# Patient Record
Sex: Female | Born: 1960 | Race: White | Hispanic: No | Marital: Married | Smoking: Never smoker
Health system: Southern US, Community
[De-identification: ages and names within clinical notes are randomized; demographics above are authoritative.]

## PROBLEM LIST (undated history)

## (undated) DIAGNOSIS — T4145XA Adverse effect of unspecified anesthetic, initial encounter: Secondary | ICD-10-CM

## (undated) DIAGNOSIS — R51 Headache: Secondary | ICD-10-CM

## (undated) DIAGNOSIS — G2581 Restless legs syndrome: Secondary | ICD-10-CM

## (undated) DIAGNOSIS — Z1379 Encounter for other screening for genetic and chromosomal anomalies: Secondary | ICD-10-CM

## (undated) DIAGNOSIS — R519 Headache, unspecified: Secondary | ICD-10-CM

## (undated) DIAGNOSIS — F329 Major depressive disorder, single episode, unspecified: Secondary | ICD-10-CM

## (undated) DIAGNOSIS — F1120 Opioid dependence, uncomplicated: Secondary | ICD-10-CM

## (undated) DIAGNOSIS — T8859XA Other complications of anesthesia, initial encounter: Secondary | ICD-10-CM

## (undated) DIAGNOSIS — E785 Hyperlipidemia, unspecified: Secondary | ICD-10-CM

## (undated) DIAGNOSIS — Z803 Family history of malignant neoplasm of breast: Secondary | ICD-10-CM

## (undated) DIAGNOSIS — F1320 Sedative, hypnotic or anxiolytic dependence, uncomplicated: Secondary | ICD-10-CM

## (undated) DIAGNOSIS — I10 Essential (primary) hypertension: Secondary | ICD-10-CM

## (undated) DIAGNOSIS — F419 Anxiety disorder, unspecified: Secondary | ICD-10-CM

## (undated) DIAGNOSIS — R112 Nausea with vomiting, unspecified: Secondary | ICD-10-CM

## (undated) DIAGNOSIS — Z9889 Other specified postprocedural states: Secondary | ICD-10-CM

## (undated) DIAGNOSIS — G4700 Insomnia, unspecified: Secondary | ICD-10-CM

## (undated) DIAGNOSIS — Z8719 Personal history of other diseases of the digestive system: Secondary | ICD-10-CM

## (undated) DIAGNOSIS — F32A Depression, unspecified: Secondary | ICD-10-CM

## (undated) HISTORY — DX: Insomnia, unspecified: G47.00

## (undated) HISTORY — DX: Anxiety disorder, unspecified: F41.9

## (undated) HISTORY — DX: Headache, unspecified: R51.9

## (undated) HISTORY — DX: Sedative, hypnotic or anxiolytic dependence, uncomplicated: F13.20

## (undated) HISTORY — DX: Restless legs syndrome: G25.81

## (undated) HISTORY — DX: Opioid dependence, uncomplicated: F11.20

## (undated) HISTORY — DX: Hyperlipidemia, unspecified: E78.5

## (undated) HISTORY — DX: Family history of malignant neoplasm of breast: Z80.3

## (undated) HISTORY — PX: BACK SURGERY: SHX140

## (undated) HISTORY — DX: Encounter for other screening for genetic and chromosomal anomalies: Z13.79

---

## 1997-09-16 ENCOUNTER — Other Ambulatory Visit: Admission: RE | Admit: 1997-09-16 | Discharge: 1997-09-16 | Payer: Self-pay | Admitting: Obstetrics and Gynecology

## 1998-01-19 ENCOUNTER — Ambulatory Visit (HOSPITAL_COMMUNITY): Admission: AD | Admit: 1998-01-19 | Discharge: 1998-01-19 | Payer: Self-pay | Admitting: Obstetrics and Gynecology

## 1998-02-01 ENCOUNTER — Ambulatory Visit (HOSPITAL_COMMUNITY): Admission: RE | Admit: 1998-02-01 | Discharge: 1998-02-01 | Payer: Self-pay | Admitting: Obstetrics and Gynecology

## 1998-04-14 ENCOUNTER — Inpatient Hospital Stay (HOSPITAL_COMMUNITY): Admission: AD | Admit: 1998-04-14 | Discharge: 1998-04-16 | Payer: Self-pay | Admitting: Obstetrics and Gynecology

## 1998-05-25 ENCOUNTER — Other Ambulatory Visit: Admission: RE | Admit: 1998-05-25 | Discharge: 1998-05-25 | Payer: Self-pay | Admitting: Obstetrics and Gynecology

## 1999-07-11 ENCOUNTER — Other Ambulatory Visit: Admission: RE | Admit: 1999-07-11 | Discharge: 1999-07-11 | Payer: Self-pay | Admitting: Obstetrics and Gynecology

## 2000-10-17 ENCOUNTER — Other Ambulatory Visit: Admission: RE | Admit: 2000-10-17 | Discharge: 2000-10-17 | Payer: Self-pay | Admitting: Obstetrics and Gynecology

## 2001-08-18 ENCOUNTER — Other Ambulatory Visit: Admission: RE | Admit: 2001-08-18 | Discharge: 2001-08-18 | Payer: Self-pay | Admitting: Obstetrics and Gynecology

## 2007-01-21 ENCOUNTER — Ambulatory Visit (HOSPITAL_COMMUNITY): Admission: RE | Admit: 2007-01-21 | Discharge: 2007-01-21 | Payer: Self-pay | Admitting: Obstetrics and Gynecology

## 2007-10-01 ENCOUNTER — Encounter: Admission: RE | Admit: 2007-10-01 | Discharge: 2007-10-01 | Payer: Self-pay | Admitting: Neurological Surgery

## 2007-10-10 ENCOUNTER — Ambulatory Visit (HOSPITAL_COMMUNITY): Admission: RE | Admit: 2007-10-10 | Discharge: 2007-10-11 | Payer: Self-pay | Admitting: Neurological Surgery

## 2007-11-10 ENCOUNTER — Encounter: Admission: RE | Admit: 2007-11-10 | Discharge: 2007-11-10 | Payer: Self-pay | Admitting: Neurological Surgery

## 2008-01-06 ENCOUNTER — Encounter: Admission: RE | Admit: 2008-01-06 | Discharge: 2008-01-06 | Payer: Self-pay | Admitting: Neurological Surgery

## 2008-04-05 ENCOUNTER — Encounter: Admission: RE | Admit: 2008-04-05 | Discharge: 2008-04-05 | Payer: Self-pay | Admitting: Neurological Surgery

## 2008-04-20 ENCOUNTER — Ambulatory Visit: Payer: Self-pay | Admitting: Internal Medicine

## 2008-04-26 DIAGNOSIS — R002 Palpitations: Secondary | ICD-10-CM

## 2008-04-26 HISTORY — DX: Palpitations: R00.2

## 2008-04-30 ENCOUNTER — Ambulatory Visit: Payer: Self-pay

## 2008-04-30 ENCOUNTER — Ambulatory Visit: Payer: Self-pay | Admitting: Internal Medicine

## 2008-04-30 ENCOUNTER — Encounter: Payer: Self-pay | Admitting: Internal Medicine

## 2008-05-07 ENCOUNTER — Ambulatory Visit: Payer: Self-pay | Admitting: Internal Medicine

## 2008-05-07 ENCOUNTER — Encounter: Admission: RE | Admit: 2008-05-07 | Discharge: 2008-05-07 | Payer: Self-pay | Admitting: Neurological Surgery

## 2008-12-22 ENCOUNTER — Ambulatory Visit (HOSPITAL_COMMUNITY): Admission: RE | Admit: 2008-12-22 | Discharge: 2008-12-22 | Payer: Self-pay | Admitting: Obstetrics and Gynecology

## 2009-01-20 ENCOUNTER — Ambulatory Visit (HOSPITAL_COMMUNITY): Admission: RE | Admit: 2009-01-20 | Discharge: 2009-01-20 | Payer: Self-pay | Admitting: Obstetrics and Gynecology

## 2009-06-24 ENCOUNTER — Encounter: Admission: RE | Admit: 2009-06-24 | Discharge: 2009-06-24 | Payer: Self-pay | Admitting: Neurology

## 2009-08-19 ENCOUNTER — Encounter: Admission: RE | Admit: 2009-08-19 | Discharge: 2009-08-19 | Payer: Self-pay | Admitting: Neurological Surgery

## 2010-05-09 IMAGING — CT CT CERVICAL SPINE W/ CM
3 of 4 series · 16 of 28 positions shown, 18 images · IV contrast (omnipaque)
Comparison: none

CLINICAL DATA: Left upper extremity pain.  No previous cervical
surgery.

CERVICAL MYELOGRAM
CT CERVICAL SPINE WITH INTRATHECAL CONTRAST
TECHNIQUE: An appropriate entry site was determined under
fluoroscopy. Skin site was marked, prepped with Betadine, and
draped in usual sterile fashion, and infiltrated locally with 1%
lidocaine. A 22 gauge spinal needle was  advanced into the thecal
sac at L4-5 from right interlaminar approach. Clear colorless CSF
returned. 10 ml Omnipaque 300 were administered intrathecally for
cervical myelography, followed by axial CT scanning of the cervical
spine. Coronal and sagittal reconstructions were generated.

[Series 2: cervical spine · axial · 0.23mm/px · z∈[+58,+180]mm · 5 of 148 slices shown, 7 images]
[im 25/148  soft-tissue]
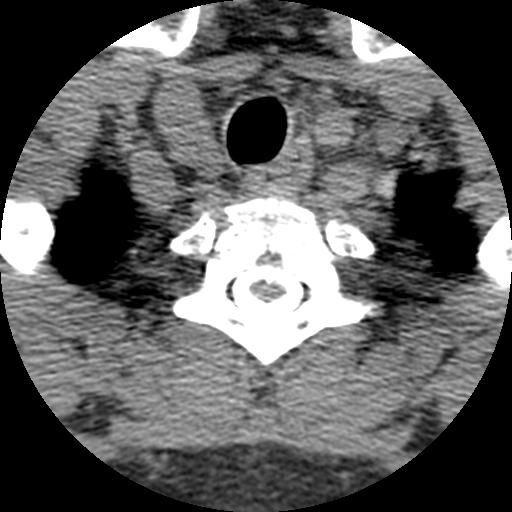
[im 25/148  bone]
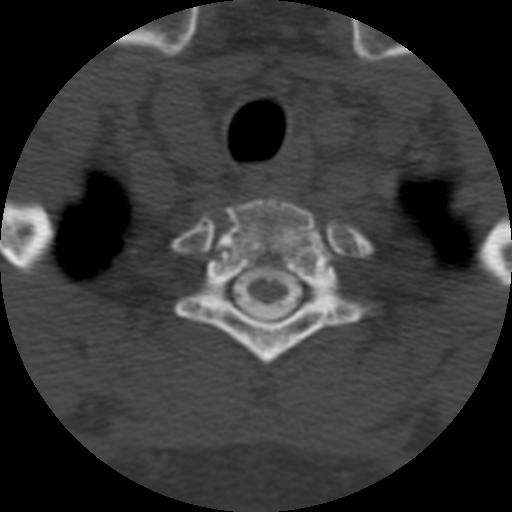
[im 50/148  bone]
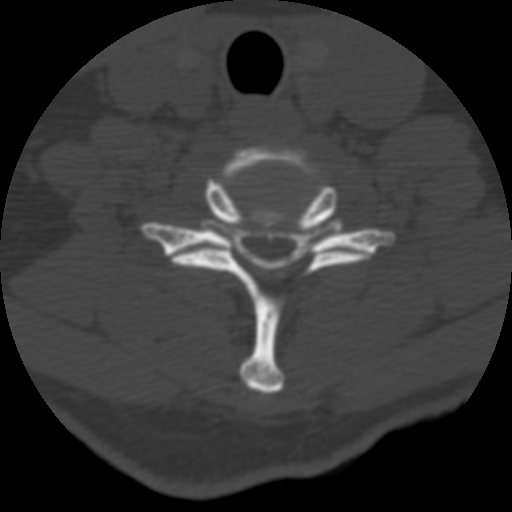
[im 74/148  bone]
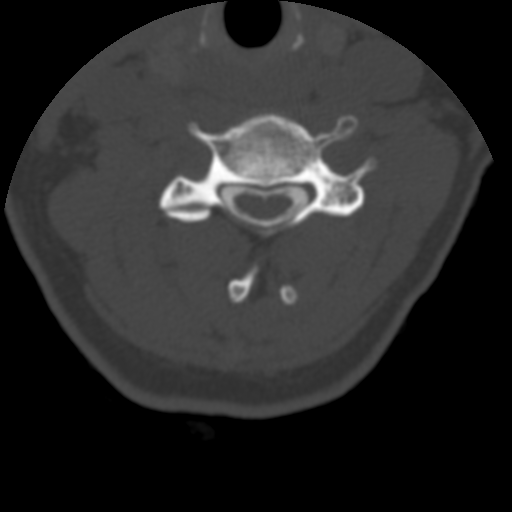
[im 99/148  bone]
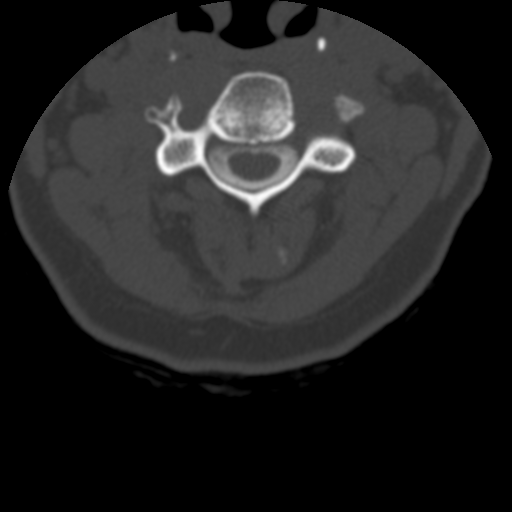
[im 123/148  soft-tissue]
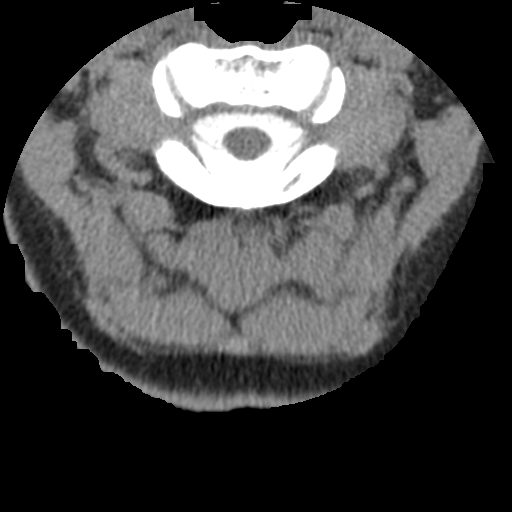
[im 123/148  bone]
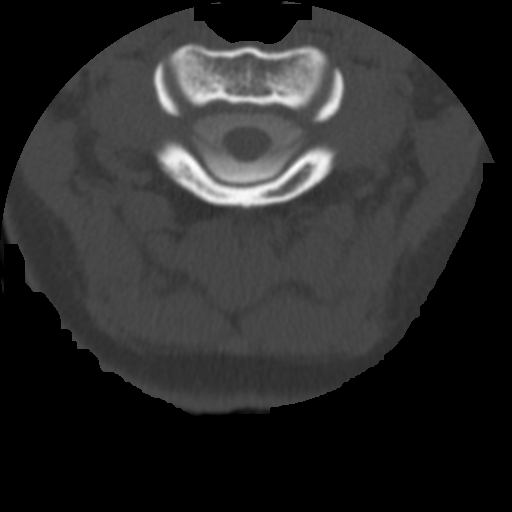

[Series 3: bone windows · axial · 0.23mm/px · z∈[+58,+180]mm · 5 of 148 slices shown]
[im 25/148  bone]
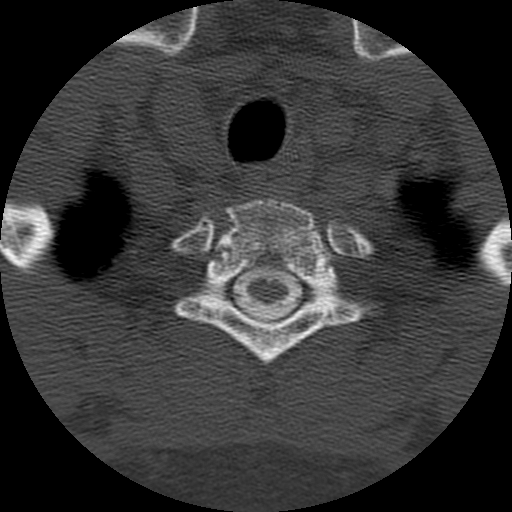
[im 50/148  bone]
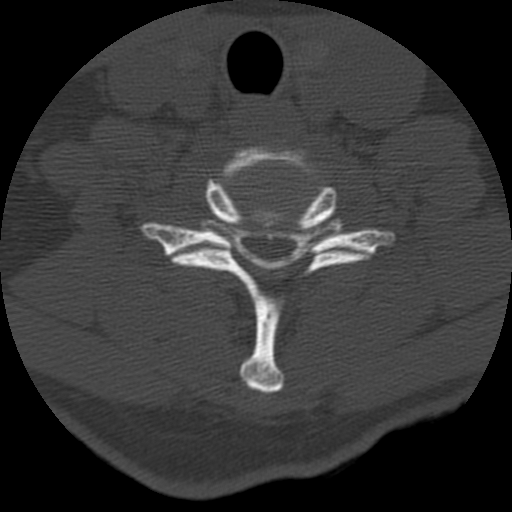
[im 74/148  bone]
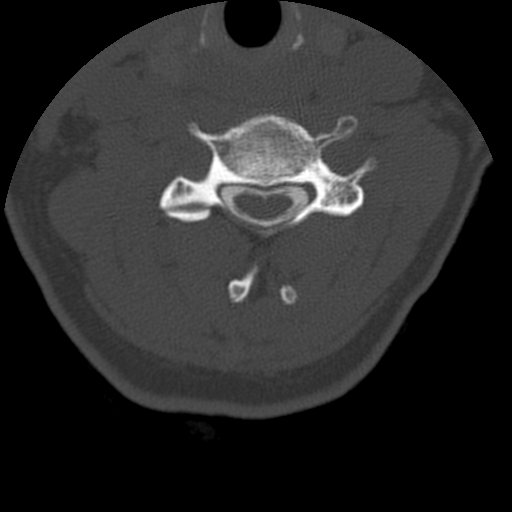
[im 99/148  bone]
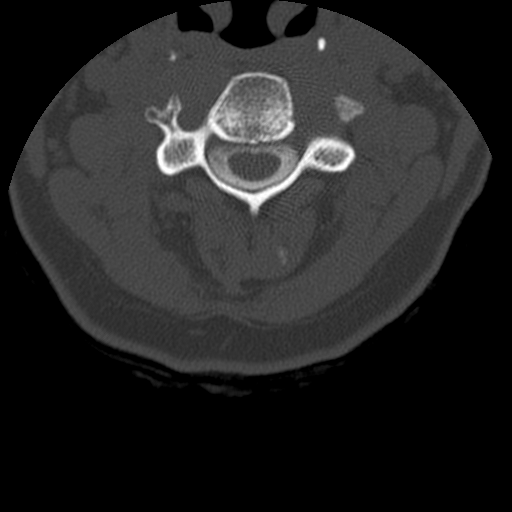
[im 123/148  bone]
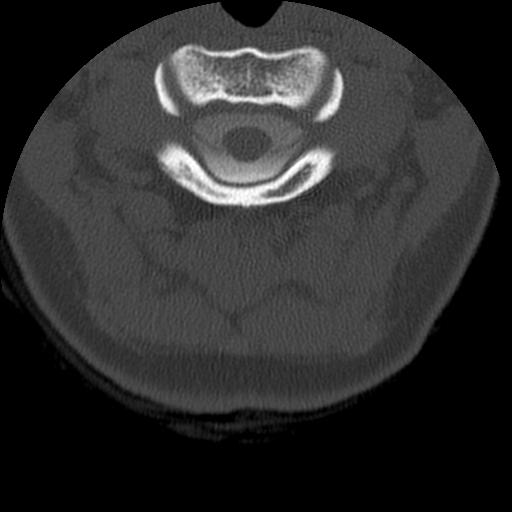

[Series 400: cor c-spine · coronal · 0.37mm/px · 6 of 40 slices shown]
[im 2/40  soft-tissue]
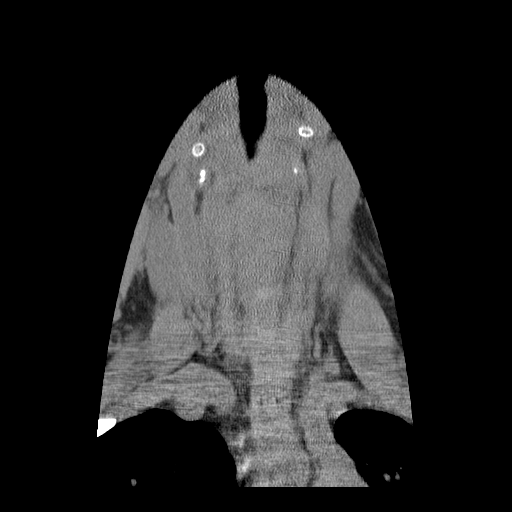
[im 7/40  bone]
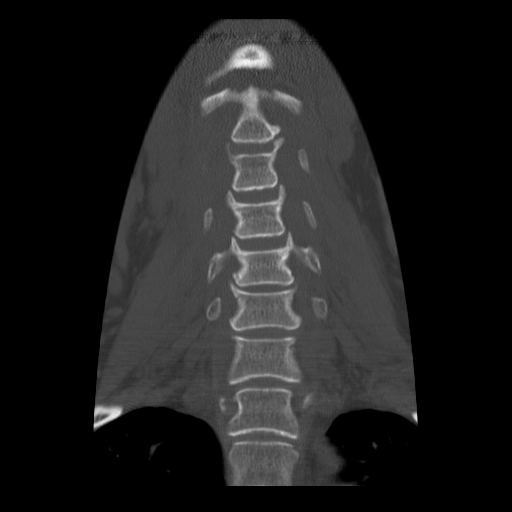
[im 14/40  bone]
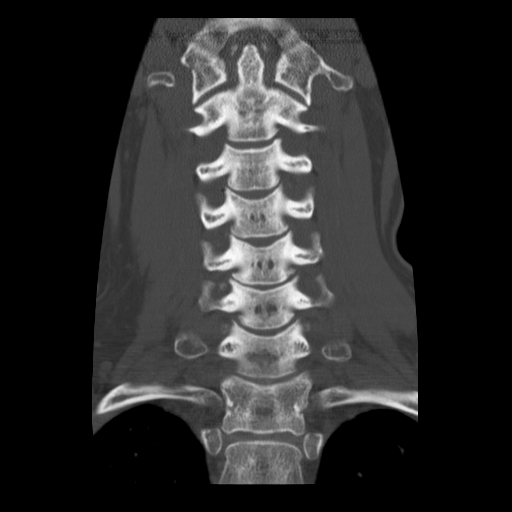
[im 20/40  bone]
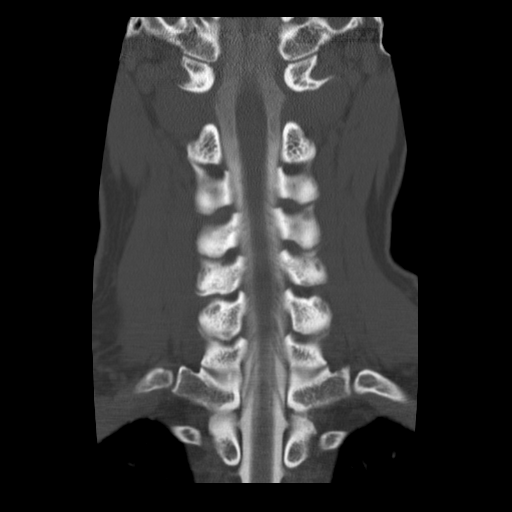
[im 27/40  bone]
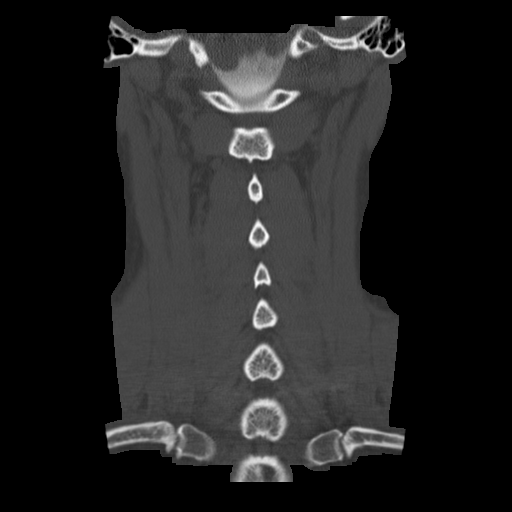
[im 33/40  bone]
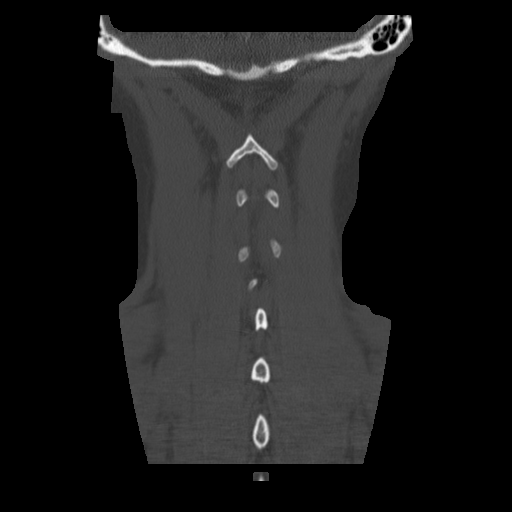

[16 of 28 positions shown; findings below may reference images not displayed]

FINDINGS: Normal alignment.  No prevertebral soft tissue swelling.
Bilateral low attenuation thyroid lesions are noted.  Visualized
lung apices clear.

C2-3: Unremarkable

C3-4: Broad central protrusion with mild flattening of the anterior
aspect of the cervical cord, right greater than left.  Foramina
widely patent.

C4-5: Mild bulge resulting in mild flattening and distortion of the
anterior aspect of the cervical cord.  Foramen are widely patent.

C5-6: Narrowing of the interspace with a broad posterior protrusion
with calcified right posterolateral and noncalcified left foraminal
components.  These contribute to moderate narrowing of the AP
diameter of the central canal with cord flattening and distortion,
right worse than left.  There is also cut off of the left C6 nerve
root sleeve.

C6-7: Mild narrowing of the interspace with mild circumferential
disc bulge and tiny endplate spurs.  There is mild flattening of
the anterior aspect of the cervical cord without significant
stenosis.  Foramina remain patent.

C7-T1: Unremarkable
IMPRESSION: 1.  Advanced degenerative disc disease C5-6 with a broad right
posterolateral hard disc and noncalcified left foraminal protrusion
probably affecting the left C6 nerve root.
2.  Mild anterior cord flattening caused by mild disc protrusions
C3-4, C4-5, and C6-7.
3.  Bilateral thyroid lesions are incidentally noted, incompletely
evaluated.  In the absence of previous studies, consider thyroid
ultrasound for further characterization to exclude neoplasm.

## 2010-10-03 NOTE — Op Note (Signed)
Elizabeth Benjamin, Elizabeth Benjamin                 ACCOUNT NO.:  [id number]   MEDICAL RECORD NO.:  [id number]          PATIENT TYPE:  OIB   LOCATION:  3535                         FACILITY:  [facility]   PHYSICIAN:  Tia Alert, MD     DATE OF BIRTH:  February 26, 1961   DATE OF PROCEDURE:  10/10/2007  DATE OF DISCHARGE:                               OPERATIVE REPORT   PREOPERATIVE DIAGNOSES:  1. Cervical disk herniation.  2. 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   POSTOPERATIVE DIAGNOSES:  1. Cervical disk herniation.  2. 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   PROCEDURE:  1. Decompressive anterior cervical diskectomy C5-6.  2. Anterior cervical arthrodesis C5-6 utilizing a 7-mm      corticocancellous allograft.  3. Anterior cervical plating C5-6 utilizing a 23-mm Atlantis Venture      plate.   SURGEON:  Tia Alert, MD   ASSISTANT:  Reinaldo Meeker, MD   ANESTHESIA:  General endotracheal.   COMPLICATIONS:  None apparent.   INDICATIONS FOR PROCEDURE:  Ms. Lightcap is a 50 year old female who is  referred with severe neck and left arm pain.  She had an MRI and CT  myelogram, which showed a soft disk herniation to the left at C5-6 with  cutoff of the left C6 nerve root.  She also had a large spur to the  right at C5-6 coming off the inferior endplate of C5.  We recommended  anterior cervical diskectomy fusion and plating.  She understood the  risks, benefits, and expected outcome and wished to proceed.   DESCRIPTION OF PROCEDURE:  The patient was taken to the operating room.  After induction of adequate general endotracheal anesthesia, the patient  was placed in the supine position on the operating room table.  Her  right anterior cervical region was prepped with DuraPrep and draped in  the usual sterile fashion.  A 3 mL of local anesthesia was injected and  transverse incision was made to the right of midline, and carried down  to the platysma.  Once it was elevated, opened, and  undermined with  Metzenbaum scissors, I then dissected in a plane medial to the  sternocleidomastoid muscle and internal carotid artery, and lateral to  the trachea and esophagus to expose C5-6 intraoperative fluoroscopy  confirmed my level at C5-6 and the longus colli muscles were taken down  and a Shadow-Line retractors were placed under this to expose C5 and C6.  The anterior annulus was incised and the initial diskectomy was done  with pituitary rongeurs and curved curettes.  We then used the high-  speed drill to drill the endplates down to the level of the posterior  longitudinal ligament.  The ligament was opened and removed in a  piecemeal fashion undercutting the bodies of C5 and C6.  She was found  to have a large disk herniation over the left C6 nerve root.  She had a  large spur coming off the inferior endplate of C5, this was undercut and  removed.  We marched along the superior endplate of C6 to identify  the  pedicle of C6 on the left marched along the nerve root to identify the  nerve root that performed a generous foraminotomy over the C6 nerve  root.  Dissected out until I identified nerve root distal to the  pedicle.  I then palpated with a nerve hook in a circumferential  fashion.  After my decompression was complete, the dura was full and  capacious, nerve root passed easily along the nerve roots.  I then  irrigated with saline solution containing bacitracin and measured  interspace to be 7 mm.  Used 7-mm corticocancellous allograft and tapped  this into position at C5-6.  I then used a 23-mm Atlantis Venture plate  and placed two 13-mm variable angle screws in the bodies of C5 and C6  and these were locked in the plate by locking mechanism within the  plate.  We then irrigated with saline solution containing bacitracin,  dried all bleeding points with bipolar cautery, and once meticulous  hemostasis was achieved, closed the platysma with 3-0 Vicryl, closed the   subcuticular tissue with 3-0 Vicryl, and closed skin with Benzoin and  Steri-Strips.  The drapes removed.  Sterile dressing was applied.  The  patient was awakened from general anesthesia and transferred to the  recovery room in stable condition.  At the end of the procedure, all  sponge, needle, and instrument counts were correct.      Tia Alert, MD  Electronically Signed     DSJ/MEDQ  D:  10/10/2007  T:  10/11/2007  Job:  [id number]

## 2010-10-03 NOTE — Letter (Signed)
April 20, 2008    Elizabeth Mire, MD  [address] [place], [place] [address]   RE:  Elizabeth Benjamin, Elizabeth Benjamin  MRN:  [id number]  /  DOB:  Oct 21, 1960   Dear Dr. Arelia Benjamin:   It was my pleasure to see your patient, Elizabeth Benjamin in EP consultation  today.  As you recall she is a very pleasant 50 year old female with  symptoms of heart racing.  She reports that these episodes have been  present approximately 6 months and appear to have begun after cervical  fusion in May 2009.  She reports gradual onset and termination of heart  racing.  These episodes are most noticeable in the evenings  particularly when she tries to sleep.  She is also woken from sleep at  approximately 3 and 4 o'clock in the morning with heart racing .  She  reports fleeting chest discomfort, but denies shortness of breath,  presyncope, or syncope during these episodes.  She has also developed  gradual shortness of breath over the past few months.  She continues to  ride a stationary bike 30 minutes a day or walk 60 minutes per day  without noticeable decline in activity; however, she does note  occasional dyspnea with one flight of stairs.  She is unaware of any  triggers or precipitance of her heart racing and is also unaware of  anything that seems to make him better.  She does not however that  during the day when she is quite active, she does not have episodes.  She denies any recent changes in her medications.  She has documented  heart rates at home during these episodes and they are typically between  80 and 122 beats per minute.  She is otherwise without complaint today.   PAST MEDICAL HISTORY:  1. Degenerative disk disease status post cervical decompression, Oct 10, 2007.  2. Anxiety.   ALLERGIES:  No known drug allergies.   CURRENT MEDICATIONS:  1. Multivitamin daily.  2. Ambien ER 12.5 mg daily.  3. Zoloft 50 mg daily.   SOCIAL HISTORY:  The patient lives in [place] with her husband and  four  children.  She has recently been told that she is now [redacted] weeks pregnant.  She denies tobacco, alcohol, or drug use.   FAMILY HISTORY:  She is unaware of any family history of arrhythmias or  sudden death.   REVIEW OF SYSTEMS:  All systems are reviewed and negative except as  outlined in the HPI above.   PHYSICAL EXAMINATION:  VITALS:  Blood pressure 120/80, heart rate 67,  respirations 18, weight 155 pounds.  GENERAL:  The patient is a well-appearing female in no acute distress.  She is alert and oriented x3.  HEENT:  Normocephalic, atraumatic.  Sclerae clear.  Conjunctivae pink.  Oropharynx clear.  NECK:  Supple.  No JVD, lymphadenopathy, or bruits.  LUNGS:  Clear to auscultation bilaterally.  HEART:  Regular rate and rhythm.  No murmurs, rubs, or gallops.  GI:  Soft, nontender, nondistended.  Positive bowel sounds.  EXTREMITIES:  No clubbing, cyanosis, or edema.  NEUROLOGIC:  Cranial nerves II through XII are intact.  Strength and  sensation are intact.  SKIN:  No ecchymosis or lacerations.  MUSCULOSKELETAL:  No deformity or atrophy.   EKG normal sinus rhythm at 67 beats per minute, incomplete right bundle  branch block, otherwise unremarkable.   IMPRESSION:  Elizabeth Benjamin is a pleasant 50 year old female with  symptoms of  heart racing which had been present over the past 6 months.  Her  description of a gradual onset and termination of tachycardia suggest a  benign etiology.  Nevertheless, she could certainly have a  supraventricular tachycardia.   PLAN:  Therapeutic strategies for palpitations and tachycardia were  discussed in detail with the patient today.  We will obtain a 48-hour  Holter to evaluate her symptoms.  We will also obtain a transthoracic  echocardiogram to evaluate for structural heart disease.  I have asked  the patient  to return to clinic in 3 weeks to discuss these results.  Thank you for  the opportunity of participating in the care of Elizabeth Benjamin.   Please feel  free to contact me if you wish to discuss this further.    Sincerely,      Elizabeth Range, MD  Electronically Signed    JA/MedQ  DD: 04/20/2008  DT: 04/21/2008  Job #: [id number]   CC:    Elizabeth Marion, MD

## 2010-10-03 NOTE — Letter (Signed)
May 07, 2008    Juluis Mire, MD  [address] [place],  [place] [address]   RE:  Elizabeth, Benjamin  MRN:  [id number]  /  DOB:  August 11, 1960   Dear Dr. Arelia Sneddon:   It was my pleasure to see Elizabeth Benjamin in electrophysiology followup  today.  As you recall, she is a pleasant 50 year old female with  symptoms of heart racing over the past 6 months.  Upon recent  evaluation in my clinic it was felt that she likely had sinus  tachycardia secondary to anxiety.  Nevertheless, did think that it was  prudent to evaluate for other arrhythmias.  We therefore placed a 24-  hour Holter, as well as obtain an echocardiogram.  The patient reports  doing very well since that time.  She notes that her symptoms of heart  racing have significantly improved since discontinuing Effexor and  starting Zoloft.  She feels that her symptoms were primarily due to  anxiety and have improved with treatment of anxiety.  She is otherwise  without complaint today.   PROBLEM:  1. Anxiety.  2. Degenerative disk disease status post cervical decompression, Oct 10, 2007.   CURRENT MEDICATIONS:  1. Zoloft 50 mg daily.  2. Ambien CR 12.5 mg daily.  3. Multivitamin daily.  4. Meloxicam 15 mg b.i.d.   PHYSICAL EXAMINATION:  VITAL SIGNS:  Blood pressure 110/80, heart rate  62, respirations 18, weight 155 pounds.  GENERAL:  The patient is a well-appearing female in no acute distress.  She is alert and oriented x3.  HEENT:  Normocephalic, atraumatic.  Sclerae clear.  Conjunctivae pink.  Oropharynx clear.  NECK:  Supple.  No JVD, lymphadenopathy, or bruits.  LUNGS:  Clear to auscultation bilaterally.  HEART:  Regular rate and rhythm.  No murmurs, rubs, or gallops.  GI:  Soft, nontender, nondistended.  Positive bowel sounds.  EXTREMITIES:  No clubbing, cyanosis, or edema.  NEUROLOGIC:  Nonfocal.   Holter obtained, April 30, 2008, reveals sinus rhythm with an average  heart rate of 69 beats per  minute.  The minimum heart rate is 54 with a  maximum heart rate of 116 beats per minute.  No significant arrhythmias  were identified.   Transthoracic echocardiogram obtained on April 30, 2008, reveals a  left ventricular ejection fraction of 55-60% with no significant wall  motion abnormalities.  Mild mitral regurgitation was observed, otherwise  the study was unremarkable.   IMPRESSION:  Elizabeth Benjamin is a very pleasant 50 year old female with  symptoms of heart racing which had significantly improved since  treating her anxiety.  A recent Holter monitor revealed a normal heart  rate range without significant arrhythmias.  She also has no evidence of  structural heart disease by echocardiogram.  I therefore think that her  symptoms are benign.  I have made no changes to her medicines today and  have recommended that she continue to follow in your office.   PLAN:  1. No medication changes today.  2. The patient is aware that she may contact my office at any time for      followup.   Thank you for the opportunity of participating in the care of Elizabeth Benjamin.  Please feel free to contact me should any problems arise.    Sincerely,      [place] Range, MD  Electronically Signed    JA/MedQ  DD: 05/07/2008  DT: 05/08/2008  Job #: [id number]

## 2011-02-14 LAB — CBC
HCT: 39.7
Hemoglobin: 13.7
MCHC: 34.4
Platelets: 165
WBC: 4.8

## 2011-02-14 LAB — PROTIME-INR: Prothrombin Time: 12.2

## 2011-02-14 LAB — BASIC METABOLIC PANEL
Calcium: 9.4
Chloride: 103
GFR calc Af Amer: >60
Glucose, Bld: 92
Potassium: 4.5
Sodium: 140

## 2011-02-14 LAB — DIFFERENTIAL
Basophils Relative: 0
Eosinophils Absolute: 0.1
Eosinophils Relative: 1
Lymphocytes Relative: 32
Lymphs Abs: 1.5
Monocytes Absolute: 0.5
Neutrophils Relative %: 56

## 2011-02-14 LAB — APTT: aPTT: 29

## 2011-03-02 LAB — RH IMMUNE GLOBULIN WORKUP (NOT WOMEN'S HOSP)
ABO/RH(D): O NEG
Antibody Screen: NEGATIVE

## 2012-02-15 ENCOUNTER — Other Ambulatory Visit: Payer: Self-pay | Admitting: Neurological Surgery

## 2012-02-15 DIAGNOSIS — M542 Cervicalgia: Secondary | ICD-10-CM

## 2012-02-25 ENCOUNTER — Ambulatory Visit
Admission: RE | Admit: 2012-02-25 | Discharge: 2012-02-25 | Disposition: A | Discharge status: Home / Self Care | Payer: 59 | Source: Ambulatory Visit | Attending: Neurological Surgery | Admitting: Neurological Surgery

## 2012-02-25 VITALS — BP 138/78 | HR 72

## 2012-02-25 DIAGNOSIS — M542 Cervicalgia: Secondary | ICD-10-CM

## 2012-02-25 MED ORDER — HYDROMORPHONE HCL PF 2 MG/ML IJ SOLN
2.0000 mg | Freq: Once | INTRAMUSCULAR | Status: AC
  Administered 2012-02-25: 2 mg via INTRAMUSCULAR

## 2012-02-25 MED ORDER — DIAZEPAM 5 MG PO TABS
10.0000 mg | ORAL_TABLET | Freq: Once | ORAL | Status: AC
  Administered 2012-02-25: 10 mg via ORAL

## 2012-02-25 MED ORDER — ONDANSETRON HCL 4 MG/2ML IJ SOLN
4.0000 mg | Freq: Once | INTRAMUSCULAR | Status: AC
  Administered 2012-02-25: 4 mg via INTRAMUSCULAR

## 2012-02-25 MED ORDER — IOHEXOL 300 MG/ML  SOLN
10.0000 mL | Freq: Once | INTRAMUSCULAR | Status: AC | PRN
  Administered 2012-02-25: 10 mL via INTRATHECAL

## 2012-02-25 NOTE — Progress Notes (Signed)
Pt requested pain meds for neck pain during myelo, meds given.  dd

## 2012-02-25 NOTE — Progress Notes (Signed)
Patient states she has been off Cymbalta and Imitrex for the past two days.  Donell Sievert, RN

## 2013-11-11 ENCOUNTER — Other Ambulatory Visit: Payer: Self-pay | Admitting: Neurological Surgery

## 2013-11-17 NOTE — Pre-Procedure Instructions (Addendum)
Shakerria A Goetze  11/17/2013   Your procedure is scheduled on:  Thursday, July 10th.   [facility]  [facility]30  PM.  Call this number if you have problems the morning of surgery: [phone]   Remember:   Do not eat food or drink liquids after midnight Wednesday.   Take these medicines the morning of surgery with A SIP OF WATER:    Do not wear jewelry, make-up or nail polish.  Do not wear lotions, powders, or perfumes. You may NOT wear deodorant.  Do not shave underarms & legs 48 hours prior to surgery.    Do not bring valuables to the hospital.  [facility] not responsible for any belongings or valuables.               Contacts, dentures or bridgework may not be worn into surgery.  Leave suitcase in the car. After surgery it may be brought to your room.  For patients admitted to the hospital, discharge time is determined by your treatment team.             Name and phone number of your driver:    Special Instructions: "Preparing for Surgery" instruction sheet.   Please read over the following fact sheets that you were given: Pain Booklet, Coughing and Deep Breathing, MRSA Information and Surgical Site Infection Prevention

## 2013-11-18 ENCOUNTER — Ambulatory Visit (HOSPITAL_COMMUNITY)
Admission: RE | Admit: 2013-11-18 | Discharge: 2013-11-18 | Disposition: A | Discharge status: Home / Self Care | Payer: 59 | Source: Ambulatory Visit | Attending: Neurological Surgery | Admitting: Neurological Surgery

## 2013-11-18 ENCOUNTER — Encounter (HOSPITAL_COMMUNITY): Payer: Self-pay

## 2013-11-18 ENCOUNTER — Encounter (HOSPITAL_COMMUNITY)
Admission: RE | Admit: 2013-11-18 | Discharge: 2013-11-18 | Disposition: A | Discharge status: Home / Self Care | Payer: 59 | Source: Ambulatory Visit | Attending: Neurological Surgery | Admitting: Neurological Surgery

## 2013-11-18 DIAGNOSIS — Z01810 Encounter for preprocedural cardiovascular examination: Secondary | ICD-10-CM | POA: Diagnosis not present

## 2013-11-18 DIAGNOSIS — Z01818 Encounter for other preprocedural examination: Secondary | ICD-10-CM | POA: Diagnosis not present

## 2013-11-18 DIAGNOSIS — I10 Essential (primary) hypertension: Secondary | ICD-10-CM | POA: Insufficient documentation

## 2013-11-18 HISTORY — DX: Depression, unspecified: F32.A

## 2013-11-18 HISTORY — DX: Major depressive disorder, single episode, unspecified: F32.9

## 2013-11-18 HISTORY — DX: Adverse effect of unspecified anesthetic, initial encounter: T41.45XA

## 2013-11-18 HISTORY — DX: Essential (primary) hypertension: I10

## 2013-11-18 HISTORY — DX: Personal history of other diseases of the digestive system: Z87.19

## 2013-11-18 HISTORY — DX: Other specified postprocedural states: Z98.890

## 2013-11-18 HISTORY — DX: Other complications of anesthesia, initial encounter: T88.59XA

## 2013-11-18 HISTORY — DX: Headache: R51

## 2013-11-18 HISTORY — DX: Nausea with vomiting, unspecified: R11.2

## 2013-11-18 LAB — BASIC METABOLIC PANEL
ANION GAP: 15 (ref 5–15)
BUN: 13 mg/dL (ref 6–23)
CHLORIDE: 101 meq/L (ref 96–112)
CO2: 24 meq/L (ref 19–32)
Calcium: 9.5 mg/dL (ref 8.4–10.5)
Creatinine, Ser: 0.67 mg/dL (ref 0.50–1.10)
GFR calc Af Amer: >90 mL/min (ref >90)
GFR calc non Af Amer: >90 mL/min (ref >90)
GLUCOSE: 104 mg/dL — AB (ref 70–99)
Potassium: 3.9 mEq/L (ref 3.7–5.3)
SODIUM: 140 meq/L (ref 137–147)

## 2013-11-18 LAB — CBC
HCT: 39.6 % (ref 36.0–46.0)
HEMOGLOBIN: 13.2 g/dL (ref 12.0–15.0)
MCH: 29.7 pg (ref 26.0–34.0)
MCHC: 33.3 g/dL (ref 30.0–36.0)
MCV: 89.2 fL (ref 78.0–100.0)
PLATELETS: 251 10*3/uL (ref 150–400)
RBC: 4.44 MIL/uL (ref 3.87–5.11)
RDW: 12.6 % (ref 11.5–15.5)
WBC: 7.1 10*3/uL (ref 4.0–10.5)

## 2013-11-18 LAB — HCG, SERUM, QUALITATIVE: PREG SERUM: NEGATIVE

## 2013-11-18 LAB — SURGICAL PCR SCREEN
MRSA, PCR: NEGATIVE
Staphylococcus aureus: NEGATIVE

## 2013-12-01 MED ORDER — CEFAZOLIN SODIUM-DEXTROSE 2-3 GM-% IV SOLR
2.0000 g | INTRAVENOUS | Status: AC
  Administered 2013-12-02: 2 g via INTRAVENOUS
  Filled 2013-12-01: qty 50

## 2013-12-02 ENCOUNTER — Encounter (HOSPITAL_COMMUNITY)
Admission: RE | Disposition: A | Discharge status: Home / Self Care | Payer: Self-pay | Source: Ambulatory Visit | Attending: Neurological Surgery

## 2013-12-02 ENCOUNTER — Ambulatory Visit (HOSPITAL_COMMUNITY): Payer: 59 | Admitting: Certified Registered Nurse Anesthetist

## 2013-12-02 ENCOUNTER — Ambulatory Visit (HOSPITAL_COMMUNITY): Payer: 59

## 2013-12-02 ENCOUNTER — Ambulatory Visit (HOSPITAL_COMMUNITY)
Admission: RE | Admit: 2013-12-02 | Discharge: 2013-12-03 | Disposition: A | Discharge status: Home / Self Care | Payer: 59 | Source: Ambulatory Visit | Attending: Neurological Surgery | Admitting: Neurological Surgery

## 2013-12-02 ENCOUNTER — Encounter (HOSPITAL_COMMUNITY): Payer: 59 | Admitting: Certified Registered Nurse Anesthetist

## 2013-12-02 ENCOUNTER — Encounter (HOSPITAL_COMMUNITY): Payer: Self-pay | Admitting: *Deleted

## 2013-12-02 DIAGNOSIS — F329 Major depressive disorder, single episode, unspecified: Secondary | ICD-10-CM | POA: Insufficient documentation

## 2013-12-02 DIAGNOSIS — K429 Umbilical hernia without obstruction or gangrene: Secondary | ICD-10-CM | POA: Insufficient documentation

## 2013-12-02 DIAGNOSIS — I10 Essential (primary) hypertension: Secondary | ICD-10-CM | POA: Insufficient documentation

## 2013-12-02 DIAGNOSIS — F3289 Other specified depressive episodes: Secondary | ICD-10-CM | POA: Insufficient documentation

## 2013-12-02 DIAGNOSIS — G43909 Migraine, unspecified, not intractable, without status migrainosus: Secondary | ICD-10-CM | POA: Insufficient documentation

## 2013-12-02 DIAGNOSIS — K219 Gastro-esophageal reflux disease without esophagitis: Secondary | ICD-10-CM | POA: Insufficient documentation

## 2013-12-02 DIAGNOSIS — M47812 Spondylosis without myelopathy or radiculopathy, cervical region: Secondary | ICD-10-CM | POA: Insufficient documentation

## 2013-12-02 DIAGNOSIS — Z981 Arthrodesis status: Secondary | ICD-10-CM

## 2013-12-02 HISTORY — DX: Arthrodesis status: Z98.1

## 2013-12-02 HISTORY — PX: ANTERIOR CERVICAL DECOMP/DISCECTOMY FUSION: SHX1161

## 2013-12-02 SURGERY — ANTERIOR CERVICAL DECOMPRESSION/DISCECTOMY FUSION 2 LEVEL/HARDWARE REMOVAL
Anesthesia: General

## 2013-12-02 MED ORDER — OXYCODONE HCL 5 MG PO TABS
10.0000 mg | ORAL_TABLET | Freq: Once | ORAL | Status: AC
  Administered 2013-12-02: 10 mg via ORAL

## 2013-12-02 MED ORDER — DIAZEPAM 5 MG/ML IJ SOLN
2.5000 mg | Freq: Once | INTRAMUSCULAR | Status: AC
  Administered 2013-12-02: 2.5 mg via INTRAVENOUS

## 2013-12-02 MED ORDER — SODIUM CHLORIDE 0.9 % IV SOLN: 250.0000 mL | INTRAVENOUS | Status: DC

## 2013-12-02 MED ORDER — PANTOPRAZOLE SODIUM 40 MG IV SOLR
40.0000 mg | Freq: Every day | INTRAVENOUS | Status: DC
  Administered 2013-12-02: 40 mg via INTRAVENOUS
  Filled 2013-12-02 (×2): qty 40

## 2013-12-02 MED ORDER — DULOXETINE HCL 60 MG PO CPEP
60.0000 mg | ORAL_CAPSULE | Freq: Every day | ORAL | Status: DC
  Administered 2013-12-02 – 2013-12-03 (×2): 60 mg via ORAL
  Filled 2013-12-02 (×2): qty 1

## 2013-12-02 MED ORDER — MENTHOL 3 MG MT LOZG: 1.0000 | LOZENGE | OROMUCOSAL | Status: DC | PRN

## 2013-12-02 MED ORDER — DIAZEPAM 5 MG PO TABS
5.0000 mg | ORAL_TABLET | Freq: Four times a day (QID) | ORAL | Status: DC | PRN
  Administered 2013-12-02 – 2013-12-03 (×4): 5 mg via ORAL
  Filled 2013-12-02 (×3): qty 1

## 2013-12-02 MED ORDER — OXYCODONE-ACETAMINOPHEN 5-325 MG PO TABS: 2.0000 | ORAL_TABLET | Freq: Once | ORAL | Status: DC

## 2013-12-02 MED ORDER — MIDAZOLAM HCL 2 MG/2ML IJ SOLN
INTRAMUSCULAR | Status: AC
Start: 2013-12-02 — End: 2013-12-02
  Filled 2013-12-02: qty 2

## 2013-12-02 MED ORDER — OXYCODONE HCL 5 MG PO TABS
ORAL_TABLET | ORAL | Status: AC
  Filled 2013-12-02: qty 2

## 2013-12-02 MED ORDER — DIAZEPAM 5 MG/ML IJ SOLN
INTRAMUSCULAR | Status: AC
  Filled 2013-12-02: qty 2

## 2013-12-02 MED ORDER — FENTANYL CITRATE 0.05 MG/ML IJ SOLN
INTRAMUSCULAR | Status: DC | PRN
  Administered 2013-12-02: 150 ug via INTRAVENOUS
  Administered 2013-12-02: 100 ug via INTRAVENOUS

## 2013-12-02 MED ORDER — SUFENTANIL CITRATE 50 MCG/ML IV SOLN
INTRAVENOUS | Status: DC | PRN
  Administered 2013-12-02 (×3): 10 ug via INTRAVENOUS
  Administered 2013-12-02: 20 ug via INTRAVENOUS

## 2013-12-02 MED ORDER — DEXAMETHASONE SODIUM PHOSPHATE 4 MG/ML IJ SOLN
4.0000 mg | Freq: Four times a day (QID) | INTRAMUSCULAR | Status: DC
  Filled 2013-12-02 (×5): qty 1

## 2013-12-02 MED ORDER — LIDOCAINE HCL (CARDIAC) 20 MG/ML IV SOLN
INTRAVENOUS | Status: AC
  Filled 2013-12-02: qty 5

## 2013-12-02 MED ORDER — PROMETHAZINE HCL 25 MG/ML IJ SOLN
6.2500 mg | INTRAMUSCULAR | Status: DC | PRN
Start: 2013-12-02 — End: 2013-12-02

## 2013-12-02 MED ORDER — HYDROMORPHONE HCL PF 1 MG/ML IJ SOLN
INTRAMUSCULAR | Status: AC
  Filled 2013-12-02: qty 2

## 2013-12-02 MED ORDER — PROPOFOL 10 MG/ML IV BOLUS
INTRAVENOUS | Status: AC
  Filled 2013-12-02: qty 20

## 2013-12-02 MED ORDER — POTASSIUM CHLORIDE IN NACL 20-0.9 MEQ/L-% IV SOLN
INTRAVENOUS | Status: DC
  Filled 2013-12-02 (×2): qty 1000

## 2013-12-02 MED ORDER — SODIUM CHLORIDE 0.9 % IJ SOLN: 3.0000 mL | INTRAMUSCULAR | Status: DC | PRN

## 2013-12-02 MED ORDER — HYDROMORPHONE HCL PF 1 MG/ML IJ SOLN: 0.2500 mg | INTRAMUSCULAR | Status: DC | PRN

## 2013-12-02 MED ORDER — BUPIVACAINE HCL (PF) 0.25 % IJ SOLN
INTRAMUSCULAR | Status: DC | PRN
  Administered 2013-12-02: 5 mL

## 2013-12-02 MED ORDER — LIDOCAINE HCL (CARDIAC) 20 MG/ML IV SOLN
INTRAVENOUS | Status: DC | PRN
  Administered 2013-12-02: 80 mg via INTRAVENOUS

## 2013-12-02 MED ORDER — ACETAMINOPHEN 325 MG PO TABS: 650.0000 mg | ORAL_TABLET | ORAL | Status: DC | PRN

## 2013-12-02 MED ORDER — DEXAMETHASONE 4 MG PO TABS
4.0000 mg | ORAL_TABLET | Freq: Four times a day (QID) | ORAL | Status: DC
  Filled 2013-12-02 (×4): qty 1

## 2013-12-02 MED ORDER — MORPHINE SULFATE 2 MG/ML IJ SOLN
1.0000 mg | INTRAMUSCULAR | Status: DC | PRN
  Administered 2013-12-02 – 2013-12-03 (×2): 4 mg via INTRAVENOUS
  Administered 2013-12-03: 2 mg via INTRAVENOUS
  Filled 2013-12-02 (×2): qty 2
  Filled 2013-12-02: qty 1

## 2013-12-02 MED ORDER — EPHEDRINE SULFATE 50 MG/ML IJ SOLN
INTRAMUSCULAR | Status: AC
  Filled 2013-12-02: qty 1

## 2013-12-02 MED ORDER — HYDROMORPHONE HCL PF 1 MG/ML IJ SOLN
0.2500 mg | INTRAMUSCULAR | Status: DC | PRN
  Administered 2013-12-02 (×4): 0.5 mg via INTRAVENOUS

## 2013-12-02 MED ORDER — ONDANSETRON HCL 4 MG/2ML IJ SOLN: 4.0000 mg | INTRAMUSCULAR | Status: DC | PRN

## 2013-12-02 MED ORDER — SODIUM CHLORIDE 0.9 % IJ SOLN
INTRAMUSCULAR | Status: AC
  Filled 2013-12-02: qty 10

## 2013-12-02 MED ORDER — ONDANSETRON HCL 4 MG/2ML IJ SOLN
INTRAMUSCULAR | Status: DC | PRN
  Administered 2013-12-02: 4 mg via INTRAVENOUS

## 2013-12-02 MED ORDER — OXYCODONE HCL 5 MG PO TABS: 5.0000 mg | ORAL_TABLET | Freq: Once | ORAL | Status: DC | PRN

## 2013-12-02 MED ORDER — ALUM & MAG HYDROXIDE-SIMETH 200-200-20 MG/5ML PO SUSP: 30.0000 mL | Freq: Four times a day (QID) | ORAL | Status: DC | PRN

## 2013-12-02 MED ORDER — NEOSTIGMINE METHYLSULFATE 10 MG/10ML IV SOLN
INTRAVENOUS | Status: DC | PRN
  Administered 2013-12-02: 3 mg via INTRAVENOUS

## 2013-12-02 MED ORDER — PROPOFOL 10 MG/ML IV BOLUS
INTRAVENOUS | Status: DC | PRN
  Administered 2013-12-02: 200 mg via INTRAVENOUS

## 2013-12-02 MED ORDER — PHENYLEPHRINE 40 MCG/ML (10ML) SYRINGE FOR IV PUSH (FOR BLOOD PRESSURE SUPPORT)
PREFILLED_SYRINGE | INTRAVENOUS | Status: AC
  Filled 2013-12-02: qty 10

## 2013-12-02 MED ORDER — SODIUM CHLORIDE 0.9 % IJ SOLN
3.0000 mL | Freq: Two times a day (BID) | INTRAMUSCULAR | Status: DC
  Administered 2013-12-03: 3 mL via INTRAVENOUS

## 2013-12-02 MED ORDER — LOSARTAN POTASSIUM 50 MG PO TABS
50.0000 mg | ORAL_TABLET | Freq: Every day | ORAL | Status: DC
  Administered 2013-12-03: 50 mg via ORAL
  Filled 2013-12-02: qty 1

## 2013-12-02 MED ORDER — ACETAMINOPHEN 650 MG RE SUPP: 650.0000 mg | RECTAL | Status: DC | PRN

## 2013-12-02 MED ORDER — HYDROMORPHONE HCL PF 1 MG/ML IJ SOLN
0.5000 mg | INTRAMUSCULAR | Status: DC | PRN
  Administered 2013-12-02 (×2): 0.5 mg via INTRAVENOUS

## 2013-12-02 MED ORDER — SUCCINYLCHOLINE CHLORIDE 20 MG/ML IJ SOLN
INTRAMUSCULAR | Status: AC
  Filled 2013-12-02: qty 1

## 2013-12-02 MED ORDER — GLYCOPYRROLATE 0.2 MG/ML IJ SOLN
INTRAMUSCULAR | Status: DC | PRN
  Administered 2013-12-02: 0.4 mg via INTRAVENOUS

## 2013-12-02 MED ORDER — THROMBIN 5000 UNITS EX SOLR
CUTANEOUS | Status: DC | PRN
  Administered 2013-12-02 (×2): 5000 [IU] via TOPICAL

## 2013-12-02 MED ORDER — LAMOTRIGINE 100 MG PO TABS
100.0000 mg | ORAL_TABLET | Freq: Every day | ORAL | Status: DC
  Administered 2013-12-02 – 2013-12-03 (×2): 100 mg via ORAL
  Filled 2013-12-02 (×2): qty 1

## 2013-12-02 MED ORDER — FENTANYL CITRATE 0.05 MG/ML IJ SOLN
INTRAMUSCULAR | Status: AC
  Filled 2013-12-02: qty 5

## 2013-12-02 MED ORDER — SUFENTANIL CITRATE 50 MCG/ML IV SOLN
INTRAVENOUS | Status: AC
  Filled 2013-12-02: qty 1

## 2013-12-02 MED ORDER — OXYCODONE HCL 5 MG PO TABS
10.0000 mg | ORAL_TABLET | ORAL | Status: DC | PRN
  Administered 2013-12-02 (×2): 10 mg via ORAL
  Filled 2013-12-02: qty 2

## 2013-12-02 MED ORDER — DIAZEPAM 5 MG PO TABS
ORAL_TABLET | ORAL | Status: AC
  Filled 2013-12-02: qty 1

## 2013-12-02 MED ORDER — MIDAZOLAM HCL 5 MG/5ML IJ SOLN
INTRAMUSCULAR | Status: DC | PRN
Start: 2013-12-02 — End: 2013-12-02
  Administered 2013-12-02: 2 mg via INTRAVENOUS

## 2013-12-02 MED ORDER — OXYCODONE HCL 5 MG/5ML PO SOLN: 5.0000 mg | Freq: Once | ORAL | Status: DC | PRN

## 2013-12-02 MED ORDER — ROCURONIUM BROMIDE 100 MG/10ML IV SOLN
INTRAVENOUS | Status: DC | PRN
  Administered 2013-12-02: 50 mg via INTRAVENOUS

## 2013-12-02 MED ORDER — LEVONORGESTREL-ETHINYL ESTRAD 0.15-30 MG-MCG PO TABS: 1.0000 | ORAL_TABLET | Freq: Every day | ORAL | Status: DC

## 2013-12-02 MED ORDER — THROMBIN 5000 UNITS EX SOLR
OROMUCOSAL | Status: DC | PRN
  Administered 2013-12-02: 17:00:00 via TOPICAL

## 2013-12-02 MED ORDER — LORAZEPAM 0.5 MG PO TABS
1.0000 mg | ORAL_TABLET | Freq: Three times a day (TID) | ORAL | Status: DC | PRN
  Administered 2013-12-02: 1 mg via ORAL
  Filled 2013-12-02: qty 2

## 2013-12-02 MED ORDER — HEMOSTATIC AGENTS (NO CHARGE) OPTIME
TOPICAL | Status: DC | PRN
  Administered 2013-12-02: 1 via TOPICAL

## 2013-12-02 MED ORDER — ONDANSETRON HCL 4 MG/2ML IJ SOLN
INTRAMUSCULAR | Status: AC
  Filled 2013-12-02: qty 2

## 2013-12-02 MED ORDER — LACTATED RINGERS IV SOLN
INTRAVENOUS | Status: DC
  Administered 2013-12-02 (×2): via INTRAVENOUS

## 2013-12-02 MED ORDER — PHENOL 1.4 % MT LIQD: 1.0000 | OROMUCOSAL | Status: DC | PRN

## 2013-12-02 MED ORDER — HYDROMORPHONE HCL PF 1 MG/ML IJ SOLN
INTRAMUSCULAR | Status: AC
  Filled 2013-12-02: qty 1

## 2013-12-02 MED ORDER — 0.9 % SODIUM CHLORIDE (POUR BTL) OPTIME
TOPICAL | Status: DC | PRN
  Administered 2013-12-02: 1000 mL

## 2013-12-02 MED ORDER — CEFAZOLIN SODIUM 1-5 GM-% IV SOLN
1.0000 g | Freq: Three times a day (TID) | INTRAVENOUS | Status: AC
  Administered 2013-12-02 – 2013-12-03 (×2): 1 g via INTRAVENOUS
  Filled 2013-12-02: qty 50

## 2013-12-02 SURGICAL SUPPLY — 51 items
BAG DECANTER FOR FLEXI CONT (MISCELLANEOUS) ×2 IMPLANT
BENZOIN TINCTURE PRP APPL 2/3 (GAUZE/BANDAGES/DRESSINGS) ×2 IMPLANT
BIT DRILL (BIT) ×1
BIT DRILL 11MM (BIT) ×1 IMPLANT
BLADE 10 SAFETY STRL DISP (BLADE) ×2 IMPLANT
BONE MATRIX OSTEOCEL PRO SM (Bone Implant) ×2 IMPLANT
BUR MATCHSTICK NEURO 3.0 LAGG (BURR) ×2 IMPLANT
CAGE SMALL 7X13X15 (Cage) ×4 IMPLANT
CANISTER SUCT 3000ML (MISCELLANEOUS) ×2 IMPLANT
CONT SPEC 4OZ CLIKSEAL STRL BL (MISCELLANEOUS) ×2 IMPLANT
DRAPE C-ARM 42X72 X-RAY (DRAPES) ×4 IMPLANT
DRAPE LAPAROTOMY 100X72 PEDS (DRAPES) ×2 IMPLANT
DRAPE MICROSCOPE LEICA (MISCELLANEOUS) ×2 IMPLANT
DRAPE MICROSCOPE ZEISS OPMI (DRAPES) IMPLANT
DRAPE POUCH INSTRU U-SHP 10X18 (DRAPES) ×2 IMPLANT
DRSG OPSITE 4X5.5 SM (GAUZE/BANDAGES/DRESSINGS) ×2 IMPLANT
DRSG TELFA 3X8 NADH (GAUZE/BANDAGES/DRESSINGS) ×2 IMPLANT
DURAPREP 6ML APPLICATOR 50/CS (WOUND CARE) ×2 IMPLANT
ELECT COATED BLADE 2.86 ST (ELECTRODE) ×2 IMPLANT
ELECT REM PT RETURN 9FT ADLT (ELECTROSURGICAL) ×2
ELECTRODE REM PT RTRN 9FT ADLT (ELECTROSURGICAL) ×1 IMPLANT
GAUZE SPONGE 4X4 16PLY XRAY LF (GAUZE/BANDAGES/DRESSINGS) IMPLANT
GLOVE BIO SURGEON STRL SZ8 (GLOVE) ×4 IMPLANT
GLOVE INDICATOR 8.5 STRL (GLOVE) ×2 IMPLANT
GOWN STRL REUS W/ TWL LRG LVL3 (GOWN DISPOSABLE) ×1 IMPLANT
GOWN STRL REUS W/ TWL XL LVL3 (GOWN DISPOSABLE) ×2 IMPLANT
GOWN STRL REUS W/TWL 2XL LVL3 (GOWN DISPOSABLE) IMPLANT
GOWN STRL REUS W/TWL LRG LVL3 (GOWN DISPOSABLE) ×1
GOWN STRL REUS W/TWL XL LVL3 (GOWN DISPOSABLE) ×2
HALTER HD/CHIN CERV TRACTION D (MISCELLANEOUS) IMPLANT
HEMOSTAT POWDER KIT SURGIFOAM (HEMOSTASIS) ×2 IMPLANT
KIT BASIN OR (CUSTOM PROCEDURE TRAY) ×2 IMPLANT
KIT ROOM TURNOVER OR (KITS) ×2 IMPLANT
NEEDLE HYPO 25X1 1.5 SAFETY (NEEDLE) ×2 IMPLANT
NEEDLE SPNL 20GX3.5 QUINCKE YW (NEEDLE) ×2 IMPLANT
NS IRRIG 1000ML POUR BTL (IV SOLUTION) ×2 IMPLANT
PACK LAMINECTOMY NEURO (CUSTOM PROCEDURE TRAY) ×2 IMPLANT
PAD ARMBOARD 7.5X6 YLW CONV (MISCELLANEOUS) ×2 IMPLANT
PLATE ACP 25XINTGR LCK MECH SL (Plate) ×1 IMPLANT
PLATE ACP VENTURE (Plate) ×3 IMPLANT
RUBBERBAND STERILE (MISCELLANEOUS) ×4 IMPLANT
SCREW VA 4.0X13MM (Screw) ×12 IMPLANT
SCREW VA 4.5X13MM (Screw) ×4 IMPLANT
SPONGE INTESTINAL PEANUT (DISPOSABLE) ×2 IMPLANT
STRIP CLOSURE SKIN 1/2X4 (GAUZE/BANDAGES/DRESSINGS) ×2 IMPLANT
SUT VIC AB 3-0 SH 8-18 (SUTURE) ×4 IMPLANT
SYR 20ML ECCENTRIC (SYRINGE) IMPLANT
TOWEL OR 17X24 6PK STRL BLUE (TOWEL DISPOSABLE) ×2 IMPLANT
TOWEL OR 17X26 10 PK STRL BLUE (TOWEL DISPOSABLE) ×2 IMPLANT
TRAP SPECIMEN MUCOUS 40CC (MISCELLANEOUS) ×2 IMPLANT
WATER STERILE IRR 1000ML POUR (IV SOLUTION) ×2 IMPLANT

## 2013-12-02 NOTE — Anesthesia Postprocedure Evaluation (Signed)
  Anesthesia Post-op Note  Patient: Elizabeth Benjamin  Procedure(s) Performed: Procedure(s) with comments: Cervical Four-Five Cervical Six-Seven Anterior cervical decompression/diskectomy/fusion with removal of Cervical Five-Six (N/A) - Cervical Four-Five Cervical Six-Seven Anterior cervical decompression/diskectomy/fusion with removal of Cervical Five-Six  Patient Location: PACU  Anesthesia Type:General  Level of Consciousness: awake and alert   Airway and Oxygen Therapy: Patient Spontanous Breathing and Patient connected to nasal cannula oxygen  Post-op Pain: moderate  Post-op Assessment: Post-op Vital signs reviewed  Post-op Vital Signs: Reviewed and stable  Last Vitals:  Filed Vitals:   12/02/13 1152  BP: 162/93  Pulse: 91  Temp: 37.1 C  Resp: 20    Complications: No apparent anesthesia complications

## 2013-12-02 NOTE — Op Note (Signed)
12/02/2013  7:01 PM  PATIENT:  Elizabeth Benjamin  53 y.o. 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 Anterior cervical reexploration with removal of anterior cervical plate Z6-1, 2. Decompressive anterior cervical discectomy C4-5 and C6-7, 3. Anterior cervical arthrodesis C4-5 and C6-7 utilizing peek interbody cages packed with local autograft and morcellized allograft, 4. anterior cervical plating C4-5 and C6-7 with separate plates at each level utilizing Atlantis vision plates  SURGEON:  Marikay Alar, MD  ASSISTANTS: Dr. Wynetta Emery  ANESTHESIA:   General  EBL: 50 ml     BLOOD ADMINISTERED:none  DRAINS: None   SPECIMEN:  No Specimen  INDICATION FOR PROCEDURE: This patient underwent a previous ACDF with plating at C5-6. She presented with a long history of neck and arm pain. MRI showed adjacent level spondylosis at C4-5 and C6-7. She tried medical management without relief. Recommended ACDF with plating at C4-5 and C6-7. Patient understood the risks, benefits, and alternatives and potential outcomes and wished to proceed.  PROCEDURE DETAILS: Patient was brought to the operating room placed under general endotracheal anesthesia. Patient was placed in the supine position on the operating room table. The neck was prepped with Duraprep and draped in a sterile fashion.   Three cc of local anesthesia was injected and a transverse incision was made on the right side of the neck.  Dissection was carried down thru the subcutaneous tissue and the platysma was  elevated, opened, and undermined with Metzenbaum scissors.  Dissection was then carried out thru an avascular plane leaving the sternocleidomastoid carotid artery and jugular vein laterally and the trachea and esophagus medially. The ventral aspect of the vertebral column was identified and a localizing x-ray was taken. The C5-6 level was identified by  identifying the old plate. We used blunt dissection to remove the soft tissue and then removed the 4 screws and the plate at W9-6. The longus colli muscles were then elevated and the retractor was placed to expose C4-5 and C6-7. The exact same discectomy technique was used at both levels. The annulus was incised and the disc space entered. Discectomy was performed with micro-curettes and pituitary rongeurs. I then used the high-speed drill to drill the endplates down to the level of the posterior longitudinal ligament. The drill shavings were saved in a mucous trap for later arthrodesis. The operating microscope was draped and brought into the field provided additional magnification, illumination and visualization. Discectomy was continued posteriorly thru the disc space. Posterior longitudinal ligament was opened with a nerve hook, and then removed along with disc herniation and osteophytes, decompressing the spinal canal and thecal sac. We then continued to remove osteophytic overgrowth and disc material decompressing the neural foramina and exiting nerve roots bilaterally. The scope was angled up and down to help decompress and undercut the vertebral bodies. Once the decompression was completed we could pass a nerve hook circumferentially to assure adequate decompression in the midline and in the neural foramina at both levels. So by both visualization and palpation we felt we had an adequate decompression of the neural elements. We then measured the height of the intravertebral disc space and selected a 7 millimeter Peek interbody cage packed with autograft and morcellized allograft. It was then gently positioned in the intravertebral disc space and countersunk at both levels. I then used a Atlantis venture plate for each level and placed four variable angle screws into the vertebral bodies and locked  them into position at both levels. The wound was irrigated with bacitracin solution, checked for hemostasis  which was established and confirmed. Once meticulous hemostasis was achieved, we then proceeded with closure. The platysma was closed with interrupted 3-0 undyed Vicryl suture, the subcuticular layer was closed with interrupted 3-0 undyed Vicryl suture. The skin edges were approximated with steristrips. The drapes were removed. A sterile dressing was applied. The patient was then awakened from general anesthesia and transferred to the recovery room in stable condition. At the end of the procedure all sponge, needle and instrument counts were correct.   PLAN OF CARE: Admit for overnight observation  PATIENT DISPOSITION:  PACU - hemodynamically stable.   Delay start of Pharmacological VTE agent (>24hrs) due to surgical blood loss or risk of bleeding:  yes

## 2013-12-02 NOTE — H&P (Signed)
Subjective:   Patient is a 53 y.o. female admitted for ACDF. The patient first presented to me with complaints of neck pain and shooting pains in the arm(s). Onset of symptoms was several months ago. The pain is described as aching and occurs all day. The pain is rated severe, and is located  Base of the neck and radiates to the arms. The symptoms have been progressive. Symptoms are exacerbated by extending head backwards, and are relieved by none.  Previous work up includes MRI of cervical spine, results: disc bulge at C4-C5 and C6-C7 bilateral.  Past Medical History  Diagnosis Date  . Complication of anesthesia   . PONV (postoperative nausea and vomiting)   . H/O hiatal hernia   . Headache(784.0)     was placed on BCP for migraines  . Hypertension   . Depression     Past Surgical History  Procedure Laterality Date  . Back surgery      cervical fusion 2009    No Known Allergies  History  Substance Use Topics  . Smoking status: Never Smoker   . Smokeless tobacco: Not on file  . Alcohol Use: No    History reviewed. No pertinent family history. Prior to Admission medications   Medication Sig Start Date End Date Taking? Authorizing Provider  DULoxetine (CYMBALTA) 60 MG capsule Take 60 mg by mouth daily.   Yes Historical Provider, MD  lamoTRIgine (LAMICTAL) 100 MG tablet Take 100 mg by mouth daily.   Yes Historical Provider, MD  levonorgestrel-ethinyl estradiol (PORTIA-28) 0.15-30 MG-MCG tablet Take 1 tablet by mouth daily.   Yes Historical Provider, MD  LORazepam (ATIVAN) 1 MG tablet Take 1 mg by mouth every 8 (eight) hours as needed for anxiety.   Yes Historical Provider, MD  losartan (COZAAR) 50 MG tablet Take 50 mg by mouth daily.   Yes Historical Provider, MD  morphine (MS CONTIN) 15 MG 12 hr tablet Take 15 mg by mouth 2 (two) times daily as needed for pain.   Yes Historical Provider, MD  naproxen (EC NAPROSYN) 500 MG EC tablet Take 500 mg by mouth 2 (two) times daily as needed  (PAIN).   Yes Historical Provider, MD  omeprazole (PRILOSEC) 40 MG capsule Take 40 mg by mouth daily.   Yes Historical Provider, MD  Oxycodone HCl 10 MG TABS Take 10 mg by mouth 4 (four) times daily as needed (PAIN).   Yes Historical Provider, MD  SUMAtriptan (IMITREX) 100 MG tablet Take 100 mg by mouth every 2 (two) hours as needed for migraine or headache. May repeat in 2 hours if headache persists or recurs.   Yes Historical Provider, MD  zolpidem (AMBIEN) 10 MG tablet Take 10 mg by mouth at bedtime.   Yes Historical Provider, MD     Review of Systems  Positive ROS: neg  All other systems have been reviewed and were otherwise negative with the exception of those mentioned in the HPI and as above.  Objective: Vital signs in last 24 hours: Temp:  [98.8 F (37.1 C)] 98.8 F (37.1 C) (07/15 1152) Pulse Rate:  [91] 91 (07/15 1152) Resp:  [20] 20 (07/15 1152) BP: (162)/(93) 162/93 mmHg (07/15 1152) SpO2:  [99 %] 99 % (07/15 1152) Weight:  [87.601 kg (193 lb 2 oz)] 87.601 kg (193 lb 2 oz) (07/15 1152)  General Appearance: Alert, cooperative, no distress, appears stated age Head: Normocephalic, without obvious abnormality, atraumatic Eyes: PERRL, conjunctiva/corneas clear, EOM's intact      Neck: Supple,  symmetrical, trachea midline, Back: Symmetric, no curvature, ROM normal, no CVA tenderness Lungs:  respirations unlabored Heart: Regular rate and rhythm Abdomen: Soft, non-tender Extremities: Extremities normal, atraumatic, no cyanosis or edema Pulses: 2+ and symmetric all extremities Skin: Skin color, texture, turgor normal, no rashes or lesions  NEUROLOGIC:  Mental status: Alert and oriented x4, no aphasia, good attention span, fund of knowledge and memory  Motor Exam - grossly normal Sensory Exam - grossly normal Reflexes: 1= Coordination - grossly normal Gait - grossly normal Balance - grossly normal Cranial Nerves: I: smell Not tested  II: visual acuity  OS: nl    OD:  nl  II: visual fields Full to confrontation  II: pupils Equal, round, reactive to light  III,VII: ptosis None  III,IV,VI: extraocular muscles  Full ROM  V: mastication Normal  V: facial light touch sensation  Normal  V,VII: corneal reflex  Present  VII: facial muscle function - upper  Normal  VII: facial muscle function - lower Normal  VIII: hearing Not tested  IX: soft palate elevation  Normal  IX,X: gag reflex Present  XI: trapezius strength  5/5  XI: sternocleidomastoid strength 5/5  XI: neck flexion strength  5/5  XII: tongue strength  Normal    Data Review Lab Results  Component Value Date   WBC 7.1 11/18/2013   HGB 13.2 11/18/2013   HCT 39.6 11/18/2013   MCV 89.2 11/18/2013   PLT 251 11/18/2013   Lab Results  Component Value Date   NA 140 11/18/2013   K 3.9 11/18/2013   CL 101 11/18/2013   CO2 24 11/18/2013   BUN 13 11/18/2013   CREATININE 0.67 11/18/2013   GLUCOSE 104* 11/18/2013   Lab Results  Component Value Date   INR 0.9 10/08/2007    Assessment:   Cervical neck pain with herniated nucleus pulposus/ 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 Patient has failed conservative therapy. Planned surgery : ACDF with plates Z6-1C4-5, W9-6C6-7, removal of c5-6 plate  Plan:   I explained the condition and procedure to the patient and answered any questions.  Patient wishes to proceed with procedure as planned. Understands risks/ benefits/ and expected or typical outcomes.  JONES,DAVID S 12/02/2013 3:46 PM

## 2013-12-02 NOTE — Plan of Care (Signed)
Problem: Consults Goal: Diagnosis - Spinal Surgery Outcome: Completed/Met Date Met:  12/02/13 Cervical Spine Fusion

## 2013-12-02 NOTE — Anesthesia Preprocedure Evaluation (Addendum)
Anesthesia Evaluation  Patient identified by MRN, date of birth, ID band Patient awake  General Assessment Comment:Previous severe pain p surgery c anxiety. On narcotics At home  Reviewed: Allergy & Precautions, H&P , NPO status , Patient's Chart, lab work & pertinent test results  History of Anesthesia Complications (+) PONV  Airway Mallampati: I TM Distance: >3 FB Neck ROM: Full    Dental  (+) Teeth Intact, Dental Advisory Given   Pulmonary  breath sounds clear to auscultation        Cardiovascular hypertension, Pt. on medications Rhythm:Regular Rate:Normal     Neuro/Psych  Headaches, Depression    GI/Hepatic hiatal hernia, GERD-  Medicated,  Endo/Other  negative endocrine ROS  Renal/GU negative Renal ROS     Musculoskeletal   Abdominal   Peds  Hematology   Anesthesia Other Findings   Reproductive/Obstetrics                        Anesthesia Physical Anesthesia Plan  ASA: II  Anesthesia Plan: General   Post-op Pain Management:    Induction: Intravenous  Airway Management Planned: Oral ETT  Additional Equipment:   Intra-op Plan:   Post-operative Plan: Extubation in OR  Informed Consent: I have reviewed the patients History and Physical, chart, labs and discussed the procedure including the risks, benefits and alternatives for the proposed anesthesia with the patient or authorized representative who has indicated his/her understanding and acceptance.   Dental advisory given  Plan Discussed with: CRNA, Surgeon and Anesthesiologist  Anesthesia Plan Comments:       Anesthesia Quick Evaluation

## 2013-12-02 NOTE — Transfer of Care (Signed)
Immediate Anesthesia Transfer of Care Note  Patient: [place] PlanasMarcie H Sienkiewicz  Procedure(s) Performed: Procedure(s) with comments: Cervical Four-Five Cervical Six-Seven Anterior cervical decompression/diskectomy/fusion with removal of Cervical Five-Six (N/A) - Cervical Four-Five Cervical Six-Seven Anterior cervical decompression/diskectomy/fusion with removal of Cervical Five-Six  Patient Location: PACU  Anesthesia Type:General  Level of Consciousness: awake and alert   Airway & Oxygen Therapy: Patient Spontanous Breathing and Patient connected to nasal cannula oxygen  Post-op Assessment: Report given to PACU RN and Post -op Vital signs reviewed and stable  Post vital signs: Reviewed and stable  Complications: No apparent anesthesia complications

## 2013-12-02 NOTE — Anesthesia Procedure Notes (Signed)
Procedure Name: Intubation Date/Time: 12/02/2013 4:43 PM Performed by: Charm BargesBUTLER, Semir Brill R Pre-anesthesia Checklist: Patient identified, Emergency Drugs available, Suction available, Patient being monitored and Timeout performed Patient Re-evaluated:Patient Re-evaluated prior to inductionOxygen Delivery Method: Circle system utilized Preoxygenation: Pre-oxygenation with 100% oxygen Intubation Type: IV induction Ventilation: Mask ventilation without difficulty Laryngoscope Size: Mac and 4 Grade View: Grade II Tube type: Oral Tube size: 7.0 mm Number of attempts: 1 Airway Equipment and Method: Stylet Placement Confirmation: ETT inserted through vocal cords under direct vision,  positive ETCO2 and breath sounds checked- equal and bilateral Secured at: 22 cm Tube secured with: Tape Dental Injury: Teeth and Oropharynx as per pre-operative assessment

## 2013-12-02 NOTE — Progress Notes (Signed)
Pt c/o pain at a level 8/10.  States usually would take oxycodone at home but has not had any yet.  MS contin taken at 0300.  Dr Jacklynn BueMassagee notified and order received

## 2013-12-03 MED ORDER — SUMATRIPTAN SUCCINATE 100 MG PO TABS
100.0000 mg | ORAL_TABLET | ORAL | Status: DC | PRN
  Administered 2013-12-03: 100 mg via ORAL
  Filled 2013-12-03: qty 1

## 2013-12-03 MED ORDER — IBUPROFEN 400 MG PO TABS
400.0000 mg | ORAL_TABLET | Freq: Once | ORAL | Status: AC
  Administered 2013-12-03: 400 mg via ORAL
  Filled 2013-12-03: qty 1

## 2013-12-03 MED ORDER — OXYCODONE HCL 5 MG PO TABS
15.0000 mg | ORAL_TABLET | ORAL | Status: DC | PRN
  Administered 2013-12-03 (×2): 15 mg via ORAL
  Filled 2013-12-03 (×2): qty 3

## 2013-12-03 MED ORDER — OXYCODONE HCL 10 MG PO TABS: ORAL_TABLET | ORAL | Status: DC

## 2013-12-03 MED ORDER — KETOROLAC TROMETHAMINE 30 MG/ML IJ SOLN
30.0000 mg | Freq: Once | INTRAMUSCULAR | Status: AC
  Administered 2013-12-03: 30 mg via INTRAVENOUS

## 2013-12-03 NOTE — Discharge Summary (Signed)
Physician Discharge Summary  Patient ID: Elizabeth Benjamin MRN: [id number] DOB/AGE: [birth year] 53 y.o.  Admit date: 12/02/2013 Discharge date: 12/03/2013  Admission Diagnoses: cervical spondylosis    Discharge Diagnoses: same   Discharged Condition: good  Hospital Course: The patient was admitted on 12/02/2013 and taken to the operating room where the patient underwent ACDF. The patient tolerated the procedure well and was taken to the recovery room and then to the floor in stable condition. The hospital course was routine. There were no complications. The wound remained clean dry and intact. Pt had appropriate neck soreness. No complaints of arm pain or new N/T/W. The patient remained afebrile with stable vital signs, and tolerated a regular diet. The patient continued to increase activities, and pain was well controlled with oral pain medications.   Consults: None  Significant Diagnostic Studies:  Results for orders placed during the hospital encounter of 11/18/13  SURGICAL PCR SCREEN      Result Value Ref Range   MRSA, PCR NEGATIVE  NEGATIVE   Staphylococcus aureus NEGATIVE  NEGATIVE  CBC      Result Value Ref Range   WBC 7.1  4.0 - 10.5 K/uL   RBC 4.44  3.87 - 5.11 MIL/uL   Hemoglobin 13.2  12.0 - 15.0 g/dL   HCT 40.9  81.1 - 91.4 %   MCV 89.2  78.0 - 100.0 fL   MCH 29.7  26.0 - 34.0 pg   MCHC 33.3  30.0 - 36.0 g/dL   RDW 78.2  95.6 - 21.3 %   Platelets 251  150 - 400 K/uL  BASIC METABOLIC PANEL      Result Value Ref Range   Sodium 140  137 - 147 mEq/L   Potassium 3.9  3.7 - 5.3 mEq/L   Chloride 101  96 - 112 mEq/L   CO2 24  19 - 32 mEq/L   Glucose, Bld 104 (*) 70 - 99 mg/dL   BUN 13  6 - 23 mg/dL   Creatinine, Ser 0.86  0.50 - 1.10 mg/dL   Calcium 9.5  8.4 - 57.8 mg/dL   GFR calc non Af Amer >90  >90 mL/min   GFR calc Af Amer >90  >90 mL/min   Anion gap 15  5 - 15  HCG, SERUM, QUALITATIVE      Result Value Ref Range   Preg, Serum NEGATIVE  NEGATIVE    Dg Chest 2  View  11/18/2013   CLINICAL DATA:  Preoperative study prior to back surgery; history of hypertension and migraine headaches  EXAM: CHEST  2 VIEW  COMPARISON:  Portable chest x-ray dated November 22, 2009  FINDINGS: The lungs are adequately inflated and clear. The heart and pulmonary vascularity are normal. There is no pleural effusion. There is mild tortuosity of the descending thoracic aorta. The bony thorax is unremarkable.  IMPRESSION: There is no active cardiopulmonary disease.   Electronically Signed   By: Revel Stellmach  [place]   On: 11/18/2013 13:23   Dg Cervical Spine 2-3 Views  12/03/2013   CLINICAL DATA:  Cervical spondylosis, ACDF  EXAM: DG C-ARM 61-120 MIN; CERVICAL SPINE - 2-3 VIEW  FLUOROSCOPY TIME:  0 min 11 seconds  COMPARISON:  MRI cervical spine 07/15/2013  FINDINGS: Two digital C-arm fluoroscopic images submitted.  Bones appear demineralized.  New anterior plate and screws identified at C4-C5 with disc prosthesis.  Previously identified anterior plate and screws at C5-C6 have been removed.  New anterior plate and screws identified at C6-C7  though this is suboptimally visualized due to superimposition of the shoulders, and a disc prosthesis is not fluoroscopically evident.  Anterior support tubes.  IMPRESSION: Removal of previously visualized C5-C6 anterior plate and screws.  Anterior fusion of C4-C5 and C6-C7.   Electronically Signed   By: Ulyses SouthwardMark  Boles M.D.   On: 12/03/2013 08:38   Dg C-arm 1-60 Min  12/03/2013   CLINICAL DATA:  Cervical spondylosis, ACDF  EXAM: DG C-ARM 61-120 MIN; CERVICAL SPINE - 2-3 VIEW  FLUOROSCOPY TIME:  0 min 11 seconds  COMPARISON:  MRI cervical spine 07/15/2013  FINDINGS: Two digital C-arm fluoroscopic images submitted.  Bones appear demineralized.  New anterior plate and screws identified at C4-C5 with disc prosthesis.  Previously identified anterior plate and screws at C5-C6 have been removed.  New anterior plate and screws identified at C6-C7 though this is suboptimally  visualized due to superimposition of the shoulders, and a disc prosthesis is not fluoroscopically evident.  Anterior support tubes.  IMPRESSION: Removal of previously visualized C5-C6 anterior plate and screws.  Anterior fusion of C4-C5 and C6-C7.   Electronically Signed   By: Ulyses SouthwardMark  Boles M.D.   On: 12/03/2013 08:38    Antibiotics:  Anti-infectives   Start     Dose/Rate Route Frequency Ordered Stop   12/03/13 0100  ceFAZolin (ANCEF) IVPB 1 g/50 mL premix     1 g 100 mL/hr over 30 Minutes Intravenous Every 8 hours 12/02/13 2043 12/03/13 0834   12/02/13 0600  ceFAZolin (ANCEF) IVPB 2 g/50 mL premix     2 g 100 mL/hr over 30 Minutes Intravenous On call to O.R. 12/01/13 1415 12/02/13 1645      Discharge Exam: Blood pressure 141/86, pulse 76, temperature 97.7 F (36.5 C), temperature source Oral, resp. rate 20, height 5\' 5"  (1.651 m), weight 87.601 kg (193 lb 2 oz), SpO2 96.00%. Neurologic: Grossly normal Incision CDI  Discharge Medications:     Medication List    STOP taking these medications       naproxen 500 MG EC tablet  Commonly known as:  EC NAPROSYN      TAKE these medications       DULoxetine 60 MG capsule  Commonly known as:  CYMBALTA  Take 60 mg by mouth daily.     lamoTRIgine 100 MG tablet  Commonly known as:  LAMICTAL  Take 100 mg by mouth daily.     LORazepam 1 MG tablet  Commonly known as:  ATIVAN  Take 1 mg by mouth every 8 (eight) hours as needed for anxiety.     losartan 50 MG tablet  Commonly known as:  COZAAR  Take 50 mg by mouth daily.     morphine 15 MG 12 hr tablet  Commonly known as:  MS CONTIN  Take 15 mg by mouth 2 (two) times daily as needed for pain.     omeprazole 40 MG capsule  Commonly known as:  PRILOSEC  Take 40 mg by mouth daily.     Oxycodone HCl 10 MG Tabs  1-2 po q6 prn pain     PORTIA-28 0.15-30 MG-MCG tablet  Generic drug:  levonorgestrel-ethinyl estradiol  Take 1 tablet by mouth daily.     SUMAtriptan 100 MG tablet   Commonly known as:  IMITREX  Take 100 mg by mouth every 2 (two) hours as needed for migraine or headache. May repeat in 2 hours if headache persists or recurs.     zolpidem 10 MG tablet  Commonly known  as:  AMBIEN  Take 10 mg by mouth at bedtime.        Disposition: home   Final Dx: ACDF C4-5, C6-7      Discharge Instructions   Call MD for:  difficulty breathing, headache or visual disturbances    Complete by:  As directed      Call MD for:  persistant nausea and vomiting    Complete by:  As directed      Call MD for:  redness, tenderness, or signs of infection (pain, swelling, redness, odor or green/yellow discharge around incision site)    Complete by:  As directed      Call MD for:  severe uncontrolled pain    Complete by:  As directed      Call MD for:  temperature >100.4    Complete by:  As directed      Diet - low sodium heart healthy    Complete by:  As directed      Discharge instructions    Complete by:  As directed   No strenuous activity, no heavy lifting, no driving, may shower     Increase activity slowly    Complete by:  As directed            Follow-up Information   Follow up with Andree Golphin S, MD In 2 weeks.   Specialty:  Neurosurgery   Contact information:   [address] [place] [address] [phone]        Signed: Tia Alert 12/03/2013, 10:11 AM

## 2013-12-03 NOTE — Progress Notes (Signed)
Orthopedic Tech Progress Note Patient Details:  Elizabeth Benjamin 11/01/1960 [id number]  Ortho Devices Type of Ortho Device: Soft collar Ortho Device/Splint Interventions: Application   [name], [name] Carol 12/03/2013, 12:21 PM

## 2013-12-04 ENCOUNTER — Encounter (HOSPITAL_COMMUNITY): Payer: Self-pay | Admitting: Neurological Surgery

## 2015-07-12 DIAGNOSIS — M1990 Unspecified osteoarthritis, unspecified site: Secondary | ICD-10-CM | POA: Insufficient documentation

## 2015-07-12 HISTORY — DX: Unspecified osteoarthritis, unspecified site: M19.90

## 2016-02-13 DIAGNOSIS — Z8669 Personal history of other diseases of the nervous system and sense organs: Secondary | ICD-10-CM | POA: Insufficient documentation

## 2016-02-13 HISTORY — DX: Personal history of other diseases of the nervous system and sense organs: Z86.69

## 2016-10-03 DIAGNOSIS — G894 Chronic pain syndrome: Secondary | ICD-10-CM

## 2016-10-03 HISTORY — DX: Chronic pain syndrome: G89.4

## 2017-03-22 DIAGNOSIS — K219 Gastro-esophageal reflux disease without esophagitis: Secondary | ICD-10-CM

## 2017-03-22 DIAGNOSIS — D649 Anemia, unspecified: Secondary | ICD-10-CM | POA: Insufficient documentation

## 2017-03-22 HISTORY — DX: Gastro-esophageal reflux disease without esophagitis: K21.9

## 2017-03-22 HISTORY — DX: Anemia, unspecified: D64.9

## 2017-05-22 DIAGNOSIS — M542 Cervicalgia: Secondary | ICD-10-CM | POA: Insufficient documentation

## 2017-05-22 DIAGNOSIS — G8929 Other chronic pain: Secondary | ICD-10-CM

## 2017-05-22 DIAGNOSIS — M545 Low back pain, unspecified: Secondary | ICD-10-CM

## 2017-05-22 HISTORY — DX: Cervicalgia: M54.2

## 2017-05-22 HISTORY — DX: Other chronic pain: G89.29

## 2017-05-22 HISTORY — DX: Low back pain, unspecified: M54.50

## 2017-05-30 DIAGNOSIS — T402X5A Adverse effect of other opioids, initial encounter: Secondary | ICD-10-CM | POA: Insufficient documentation

## 2017-05-30 DIAGNOSIS — K5903 Drug induced constipation: Secondary | ICD-10-CM | POA: Insufficient documentation

## 2017-05-30 HISTORY — DX: Adverse effect of other opioids, initial encounter: T40.2X5A

## 2017-05-30 HISTORY — DX: Drug induced constipation: K59.03

## 2017-10-03 DIAGNOSIS — S62619A Displaced fracture of proximal phalanx of unspecified finger, initial encounter for closed fracture: Secondary | ICD-10-CM

## 2017-10-03 HISTORY — DX: Displaced fracture of proximal phalanx of unspecified finger, initial encounter for closed fracture: S62.619A

## 2018-04-02 DIAGNOSIS — M542 Cervicalgia: Secondary | ICD-10-CM

## 2018-04-02 HISTORY — DX: Cervicalgia: M54.2

## 2018-07-16 DIAGNOSIS — K117 Disturbances of salivary secretion: Secondary | ICD-10-CM | POA: Insufficient documentation

## 2018-07-16 DIAGNOSIS — R682 Dry mouth, unspecified: Secondary | ICD-10-CM

## 2018-07-16 DIAGNOSIS — H04123 Dry eye syndrome of bilateral lacrimal glands: Secondary | ICD-10-CM

## 2018-07-16 DIAGNOSIS — N898 Other specified noninflammatory disorders of vagina: Secondary | ICD-10-CM

## 2018-07-16 HISTORY — DX: Dry eye syndrome of bilateral lacrimal glands: H04.123

## 2018-07-16 HISTORY — DX: Other specified noninflammatory disorders of vagina: N89.8

## 2018-07-16 HISTORY — DX: Dry mouth, unspecified: R68.2

## 2018-07-16 HISTORY — DX: Disturbances of salivary secretion: K11.7

## 2018-08-21 DIAGNOSIS — G5602 Carpal tunnel syndrome, left upper limb: Secondary | ICD-10-CM

## 2018-08-21 DIAGNOSIS — G5601 Carpal tunnel syndrome, right upper limb: Secondary | ICD-10-CM | POA: Insufficient documentation

## 2018-08-21 HISTORY — DX: Carpal tunnel syndrome, left upper limb: G56.02

## 2018-08-21 HISTORY — DX: Carpal tunnel syndrome, right upper limb: G56.01

## 2018-11-18 DIAGNOSIS — R1013 Epigastric pain: Secondary | ICD-10-CM

## 2018-11-18 DIAGNOSIS — K5903 Drug induced constipation: Secondary | ICD-10-CM

## 2018-11-18 DIAGNOSIS — K30 Functional dyspepsia: Secondary | ICD-10-CM

## 2018-11-18 HISTORY — DX: Functional dyspepsia: K30

## 2018-11-18 HISTORY — DX: Epigastric pain: R10.13

## 2018-11-18 HISTORY — DX: Drug induced constipation: K59.03

## 2020-05-23 DIAGNOSIS — M961 Postlaminectomy syndrome, not elsewhere classified: Secondary | ICD-10-CM | POA: Insufficient documentation

## 2020-05-23 HISTORY — DX: Postlaminectomy syndrome, not elsewhere classified: M96.1

## 2020-12-07 ENCOUNTER — Telehealth: Payer: Self-pay

## 2020-12-07 ENCOUNTER — Telehealth: Payer: Self-pay | Admitting: Genetic Counselor

## 2020-12-07 NOTE — Telephone Encounter (Signed)
Received a genetic counseling referral from Dr. Arelia Sneddon. Elizabeth Benjamin has been cld and scheduled to see Elizabeth Benjamin on 8/3 at 1pm. Pt aware to arrive 15 minutes early.

## 2020-12-07 NOTE — Telephone Encounter (Signed)
Referral notes sent from [facility], Phone #: [phone], Fax #: [phone]   Notes sent to scheduling

## 2020-12-20 ENCOUNTER — Encounter: Payer: Self-pay | Admitting: Neurology

## 2020-12-20 ENCOUNTER — Other Ambulatory Visit: Payer: Self-pay

## 2020-12-20 ENCOUNTER — Ambulatory Visit (INDEPENDENT_AMBULATORY_CARE_PROVIDER_SITE_OTHER): Payer: 59 | Admitting: Neurology

## 2020-12-20 VITALS — BP 123/85 | HR 76 | Ht 65.0 in | Wt 191.0 lb

## 2020-12-20 DIAGNOSIS — R0683 Snoring: Secondary | ICD-10-CM

## 2020-12-20 DIAGNOSIS — Z820 Family history of epilepsy and other diseases of the nervous system: Secondary | ICD-10-CM

## 2020-12-20 DIAGNOSIS — F418 Other specified anxiety disorders: Secondary | ICD-10-CM | POA: Insufficient documentation

## 2020-12-20 DIAGNOSIS — R4189 Other symptoms and signs involving cognitive functions and awareness: Secondary | ICD-10-CM

## 2020-12-20 HISTORY — DX: Other specified anxiety disorders: F41.8

## 2020-12-20 HISTORY — DX: Other symptoms and signs involving cognitive functions and awareness: R41.89

## 2020-12-20 HISTORY — DX: Family history of epilepsy and other diseases of the nervous system: Z82.0

## 2020-12-20 HISTORY — DX: Snoring: R06.83

## 2020-12-20 NOTE — Progress Notes (Signed)
[facility]  PATIENT: Elizabeth Benjamin DOB: 1961/01/28  REFERRING DOCTOR OR PCP:  Dr. Evelene Croon (Psychiatry); Reinaldo Nodal, PA-C (PCP) SOURCE: Patient, notes from PCP, laboratory reports, MRI images personally reviewed.  _________________________________   HISTORICAL  CHIEF COMPLAINT:  Chief Complaint  Patient presents with   New Patient (Initial Visit)    Pt alone, rm 2 pt has a strong family hx of alzheimer's and she began a trial through [facility] program PAD 2 study. They completed MRI's there and she brought them in to have MD review. She states that she has noticed increase in short term memory concerns. She is just wanting to be evaluated and be ahead of things    HISTORY OF PRESENT ILLNESS:  I had the pleasure of seeing your patient, Elizabeth Benjamin. [place], at [facility] for neurologic consultation regarding her memory loss and strong family history of Alzheimer's disease.  She is a 60 year old woman with a strong FH of Alzheimer's disease who is noticing issues with STM.  She notes issues more than those around her.    She provided several examples:  She is telling a story and just forgets what she is saying and asks where she was and then continues the story.   She does not always remember things she did recently.   She has some issues coming up with the right words.  She finds hese type of issues embarrassing.  She is driving well.  Her hobby is scrap booking and she does well with that.    She sleeps well most nights taking trazodone.   She is not waking up though feels her sleep is not restful.  She snores but does not have OSA signs (never as been told).     She rests most days but does not nap.   She rarely naps because it affects her night.     No EDS.  She had a PSG 8 years ago that was normal.     She has some depression and anxiety and is better with Lexapro and Cymbalta.    She takes lorazepam before she rests and at night.     Her father, 2  paternal aunts and her sister have/had AD.    Her sister was 37 at diagnosis and others were older.     She is in the PAAD2-pre study and had MRI (sagittal T1only) 04/06/2020 and 12/09/2020.   There was no atrophy noted though coronal or axial images would be better to see.   She had cognitve testing but does not have results.      Montreal Cognitive Assessment  12/20/2020  Visuospatial/ Executive (0/5) 5  Naming (0/3) 3  Attention: Read list of digits (0/2) 1  Attention: Read list of letters (0/1) 1  Attention: Serial 7 subtraction starting at 100 (0/3) 3  Language: Repeat phrase (0/2) 1  Language : Fluency (0/1) 1  Abstraction (0/2) 2  Delayed Recall (0/5) 5  Orientation (0/6) 6  Total 28    EPWORTH SLEEPINESS SCALE  On a scale of 0 - 3 what is the chance of dozing:  Sitting and Reading:   2 Watching TV:    0 Sitting inactive in a public place: 0 Passenger in car for one hour: 0 Lying down to rest in the afternoon: 1 Sitting and talking to someone: 0 Sitting quietly after lunch:  0 In a car, stopped in traffic:  0  Total (out of 24):   3/24  Laboratory test 12/01/2019: TSH and hemoglobin A1c were normal.  REVIEW OF SYSTEMS: Constitutional: No fevers, chills, sweats, or change in appetite Eyes: No visual changes, double vision, eye pain Ear, nose and throat: No hearing loss, ear pain, nasal congestion, sore throat Cardiovascular: No chest pain, palpitations Respiratory:  No shortness of breath at rest or with exertion.   No wheezes GastrointestinaI: No nausea, vomiting, diarrhea, abdominal pain, fecal incontinence Genitourinary:  No dysuria, urinary retention or frequency.  No nocturia. Musculoskeletal:  No neck pain, back pain Integumentary: No rash, pruritus, skin lesions Neurological: as above Psychiatric: No depression at this time.  No anxiety Endocrine: No palpitations, diaphoresis, change in appetite, change in weigh or increased thirst Hematologic/Lymphatic:  No  anemia, purpura, petechiae. Allergic/Immunologic: No itchy/runny eyes, nasal congestion, recent allergic reactions, rashes  ALLERGIES: No Known Allergies  HOME MEDICATIONS:  Current Outpatient Medications:    cyclobenzaprine (FLEXERIL) 10 MG tablet, Take 10 mg by mouth 3 (three) times daily as needed., Disp: , Rfl:    doxycycline (PERIOSTAT) 20 MG tablet, Take 20 mg by mouth 2 (two) times daily., Disp: , Rfl:    DULoxetine (CYMBALTA) 60 MG capsule, Take 120 mg by mouth daily., Disp: , Rfl:    escitalopram (LEXAPRO) 10 MG tablet, Take 10 mg by mouth daily., Disp: , Rfl:    gabapentin (NEURONTIN) 300 MG capsule, Take 3 capsules by mouth in the morning, at noon, and at bedtime., Disp: , Rfl:    lisinopril (ZESTRIL) 20 MG tablet, Take 20 mg by mouth daily., Disp: , Rfl:    LORazepam (ATIVAN) 1 MG tablet, Take 1 mg by mouth every 8 (eight) hours as needed for anxiety., Disp: , Rfl:    omeprazole (PRILOSEC) 40 MG capsule, Take 40 mg by mouth daily., Disp: , Rfl:    Oxycodone HCl 10 MG TABS, 1-2 po q6 prn pain, Disp: 80 tablet, Rfl: 0   rOPINIRole (REQUIP) 1 MG tablet, Take 1 mg by mouth at bedtime as needed., Disp: , Rfl:    traZODone (DESYREL) 150 MG tablet, Take 300 mg by mouth at bedtime as needed., Disp: , Rfl:   PAST MEDICAL HISTORY: Past Medical History:  Diagnosis Date   Anxiety    Benzodiazepine dependence (HCC)    Complication of anesthesia    Depression    H/O hiatal hernia    Headache(784.0)    was placed on BCP for migraines   Hyperlipidemia    Hypertension    Insomnia    Opioid dependence (HCC)    PONV (postoperative nausea and vomiting)    Restless legs     PAST SURGICAL HISTORY: Past Surgical History:  Procedure Laterality Date   ANTERIOR CERVICAL DECOMP/DISCECTOMY FUSION N/A 12/02/2013   Procedure: Cervical Four-Five Cervical Six-Seven Anterior cervical decompression/diskectomy/fusion with removal of Cervical Five-Six;  Surgeon: Tia Alert, MD;  Location: [facility];  Service: Neurosurgery;  Laterality: N/A;  Cervical Four-Five Cervical Six-Seven Anterior cervical decompression/diskectomy/fusion with removal of Cervical Five-Six   BACK SURGERY     cervical fusion 2009    FAMILY HISTORY: No family history on file.  SOCIAL HISTORY:  Social History   Socioeconomic History   Marital status: Married    Spouse name: Not on file   Number of children: Not on file   Years of education: Not on file   Highest education level: Not on file  Occupational History   Not on file  Tobacco Use   Smoking status: Never   Smokeless tobacco:  Not on file  Substance and Sexual Activity   Alcohol use: No   Drug use: No   Sexual activity: Yes    Birth control/protection: Pill  Other Topics Concern   Not on file  Social History Narrative   Not on file   Social Determinants of Health   Financial Resource Strain: Not on file  Food Insecurity: Not on file  Transportation Needs: Not on file  Physical Activity: Not on file  Stress: Not on file  Social Connections: Not on file  Intimate Partner Violence: Not on file     PHYSICAL EXAM  Vitals:   12/20/20 1057  BP: 123/85  Pulse: 76  Weight: 191 lb (86.6 kg)  Height: 5\' 5"  (1.651 m)    Body mass index is 31.78 kg/m.   General: The patient is well-developed and well-nourished and in no acute distress.  Pharynx is Mallampati 2.  HEENT:  Head is [place]/AT.  Sclera are anicteric.  Funduscopic exam shows normal optic discs and retinal vessels.  Neck: No carotid bruits are noted.  The neck is nontender.  Cardiovascular: The heart has a regular rate and rhythm with a normal S1 and S2. There were no murmurs, gallops or rubs.    Skin: Extremities are without rash or  edema.  Musculoskeletal:  Back is nontender  Neurologic Exam  Mental status: The patient is alert and oriented x 3 at the time of the examination. The patient has apparent normal recent and remote memory, with an apparently normal  attention span and concentration ability.   Speech is normal.  Cranial nerves: Extraocular movements are full. Pupils are equal, round, and reactive to light and accomodation.  Visual fields are full.  Facial symmetry is present. There is good facial sensation to soft touch bilaterally.Facial strength is normal.  Trapezius and sternocleidomastoid strength is normal. No dysarthria is noted.  The tongue is midline, and the patient has symmetric elevation of the soft palate. No obvious hearing deficits are noted.  Motor:  Muscle bulk is normal.   Tone is normal. Strength is  5 / 5 in all 4 extremities.   Sensory: Sensory testing is intact to temperature, soft touch and vibration sensation in all 4 extremities.  Coordination: Cerebellar testing reveals good finger-nose-finger and heel-to-shin bilaterally.  Gait and station: Station is normal.   Gait is normal. Tandem gait is normal. Romberg is negative.   Reflexes: Deep tendon reflexes are symmetric and normal bilaterally.   Plantar responses are flexor.    DIAGNOSTIC DATA (LABS, IMAGING, TESTING) - I reviewed patient records, labs, notes, testing and imaging myself where available.  Lab Results  Component Value Date   WBC 7.1 11/18/2013   HGB 13.2 11/18/2013   HCT 39.6 11/18/2013   MCV 89.2 11/18/2013   PLT 251 11/18/2013      Component Value Date/Time   NA 140 11/18/2013 1301   K 3.9 11/18/2013 1301   CL 101 11/18/2013 1301   CO2 24 11/18/2013 1301   GLUCOSE 104 (H) 11/18/2013 1301   BUN 13 11/18/2013 1301   CREATININE 0.67 11/18/2013 1301   CALCIUM 9.5 11/18/2013 1301   GFRNONAA >90 11/18/2013 1301   GFRAA >90 11/18/2013 1301       ASSESSMENT AND PLAN  Subjective memory complaints - Plan: Vitamin B12, Thyroid Panel With TSH, APOE Alzheimer's Risk  Family history of Alzheimer's disease - Plan: APOE Alzheimer's Risk  Depression with anxiety - Plan: Vitamin B12, Thyroid Panel With TSH  Snoring  In summary, Ms. Penning  is a 60 year old woman with subjective memory complaints who has a strong family history of Alzheimer's disease.  On examination, she did well on the [facility] cognitive assessment score 28/30 which is normal.  Often, a middle-aged with subjective memory concerns the patient is experiencing reduced focus/attention this could be due to other medical issues or poor sleep or depression.  We will check blood work to check vitamin B12, thyroid panel.  Additionally, because of her family history we will check the ApoE alleles to help determine her Alzheimer risk.  She is in the PAAD-pre study to assess the effect of physical activity on patient's to have higher risk of AD due to family history.  She was randomized to normal activity.  She does have snoring but has not had OSA signs.  We discussed that if symptoms worsen I would want to check a sleep study as well.  She will return to see me in 1 year or sooner if there are new or worsening neurologic symptoms or based on the results of the studies.  Thank you for asking me to see Ms. Lave.  Please let me know if I can be of further assistance with her or other patients in the future.   MR reiewed Tsh, b12, Apoe4 test  Lamica Mccart A. Epimenio Foot, MD, [facility] 12/20/2020, 11:11 AM Certified in Neurology, Clinical Neurophysiology, Sleep Medicine and Neuroimaging  [facility] [address] [place], [place] [address] [phone]

## 2020-12-21 ENCOUNTER — Inpatient Hospital Stay: Payer: 59 | Attending: Genetic Counselor | Admitting: Genetic Counselor

## 2020-12-21 ENCOUNTER — Inpatient Hospital Stay: Payer: 59

## 2020-12-21 ENCOUNTER — Other Ambulatory Visit: Payer: Self-pay

## 2020-12-21 DIAGNOSIS — Z1379 Encounter for other screening for genetic and chromosomal anomalies: Secondary | ICD-10-CM | POA: Diagnosis not present

## 2020-12-21 DIAGNOSIS — Z803 Family history of malignant neoplasm of breast: Secondary | ICD-10-CM

## 2020-12-22 DIAGNOSIS — M65312 Trigger thumb, left thumb: Secondary | ICD-10-CM

## 2020-12-22 HISTORY — DX: Trigger thumb, left thumb: M65.312

## 2020-12-23 ENCOUNTER — Encounter: Payer: Self-pay | Admitting: Genetic Counselor

## 2020-12-23 DIAGNOSIS — Z1379 Encounter for other screening for genetic and chromosomal anomalies: Secondary | ICD-10-CM | POA: Insufficient documentation

## 2020-12-23 DIAGNOSIS — Z803 Family history of malignant neoplasm of breast: Secondary | ICD-10-CM | POA: Insufficient documentation

## 2020-12-23 NOTE — Progress Notes (Signed)
REFERRING PROVIDER: Arvella Nigh, MD [address] [place],  [place] [address]  PRIMARY PROVIDER:  Nodal, Alphonzo Dublin, PA-C  PRIMARY REASON FOR VISIT:  1. Family history of breast cancer   2. Genetic testing      HISTORY OF PRESENT ILLNESS:   Elizabeth Benjamin, a 60 y.o. female, was seen for a [place] cancer genetics consultation at the request of Dr. Radene Knee due to a family history of cancer.  Elizabeth Benjamin presents to clinic today to discuss the possibility of a hereditary predisposition to cancer, genetic testing, and to further clarify her future cancer risks, as well as potential cancer risks for family members.     Elizabeth Benjamin is a 60 y.o. female with no personal history of cancer.    CANCER HISTORY:  Oncology History   No history exists.     Past Medical History:  Diagnosis Date   Anxiety    Benzodiazepine dependence ([place])    Complication of anesthesia    Depression    Family history of breast cancer    Genetic testing    H/O hiatal hernia    Headache(784.0)    was placed on BCP for migraines   Hyperlipidemia    Hypertension    Insomnia    Opioid dependence (HCC)    PONV (postoperative nausea and vomiting)    Restless legs     Past Surgical History:  Procedure Laterality Date   ANTERIOR CERVICAL DECOMP/DISCECTOMY FUSION N/A 12/02/2013   Procedure: Cervical Four-Five Cervical Six-Seven Anterior cervical decompression/diskectomy/fusion with removal of Cervical Five-Six;  Surgeon: Eustace Moore, MD;  Location: [facility];  Service: Neurosurgery;  Laterality: N/A;  Cervical Four-Five Cervical Six-Seven Anterior cervical decompression/diskectomy/fusion with removal of Cervical Five-Six   BACK SURGERY     cervical fusion 2009    Social History   Socioeconomic History   Marital status: Married    Spouse name: Not on file   Number of children: Not on file   Years of education: Not on file   Highest education level: Not on file  Occupational History   Not on  file  Tobacco Use   Smoking status: Never   Smokeless tobacco: Not on file  Substance and Sexual Activity   Alcohol use: No   Drug use: No   Sexual activity: Yes    Birth control/protection: Pill  Other Topics Concern   Not on file  Social History Narrative   Not on file   Social Determinants of Health   Financial Resource Strain: Not on file  Food Insecurity: Not on file  Transportation Needs: Not on file  Physical Activity: Not on file  Stress: Not on file  Social Connections: Not on file     FAMILY HISTORY:  We obtained a detailed, 4-generation family history.  Significant diagnoses are listed below: Family History  Problem Relation Age of Onset   Dementia Father    Brain cancer Sister        ? Meningioma   Dementia Paternal Aunt    Dementia Paternal Aunt    Cerebral aneurysm Paternal Uncle 16   Cerebral aneurysm Paternal Uncle 69   Stroke Maternal Grandmother    COPD Maternal Grandfather    Cerebral aneurysm Paternal Grandmother 42   Breast cancer Cousin        pat first cousin - bilateral breast cancer   Breast cancer Niece 47       bilateral breast cancer   Ehlers-Danlos syndrome Daughter  The patient has a son and daughter who are cancer free.  She has two brothers and two sisters, one sister may have had meningiomas.  One brother has a daughter with breast cancer at 20.  Both parents are deceased.  The mother died from complications of diverticulitis.  She was an only child.  Her parents died of non-cancer related issues.    The patient's father died of complications of dementia.  He had three bothers and two sisters.  Both sisters had dementia, and two brothers died of brain aneurysms, one at 60 and the other at 68. The remaining uncle has a daughter with breast cancer.  The paternal grandparents are deceased.  The grandmother also died of a brain aneurysm . Patient's maternal ancestors are of Caucasian descent, and paternal ancestors are of [place]  descent. There is no reported Ashkenazi Jewish ancestry. There is no known consanguinity.  GENETIC TESTING:  Elizabeth Benjamin underwent genetic testing at her OB/GYN office due to a family history of bilateral breast cancer at a young age in her niece. Blood was drawn and sent to [facility].  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  Clinical condition Hereditary diffuse gastric cancer (HDGC) syndrome is a hereditary cancer predisposition syndrome characterized by an increased risk of gastric cancer within the lining of the stomach.  This type of gastric cancer can be more difficult to detect because it does not grow as a mass, as a more typical adenocarcinoma.  [place] is the result of pathogenic mutations in a gene called CDH1 and is associated with lobular breast cancer.  More recently, it has been suggested that pathogenic mutations in CTNNA1 can also increase the risk for HDGC.  Diffuse gastric cancer is a type of adenocarcinoma that infiltrates and thickens the stomach wall without forming a distinct tumor mass. Signet ring cells are often observed throughout the stomach wall. Symptoms typically present in advanced disease and can include stomach pain, nausea, vomiting, difficulty swallowing, decreased appetite, and weight loss. While the risk of gastric cancer is 56%-83% for women and 67%-80% for men (PMID: 96283662, [id number], 03546568) with pathogenic CDH1 mutations, it is unclear what the actual risk is for pathogenic CTNNA1 mutations.  It does not seem that lobular breast cancer is associated with CTNNA1 gene mutations.   Cancer risk information for CTNNA1 pathogenic variants is limited. Additionally, there are discrepancies between genetic testing laboratories on this patient's specific genetic variant.  While [facility], the laboratory this patient was tested through, considers this particular variant pathogenic due to it being a truncating variant, they have only  seen this one time (in this particular patient) and do not have additional experience with this variant.  One other laboratory was identified as having seen this variant, also one time.  They classify it as variant, uncertain significance.  Inheritance Hereditary diffuse gastric cancer (HDGC) syndrome has autosomal dominant inheritance. This means that an individual with a pathogenic variant in CDH1 has a 50% chance of passing the condition on to their offspring. Once a pathogenic mutation is detected in an individual, it is possible to identify at-risk relatives who can pursue testing for this specific familial variant. Many cases are inherited from a parent, but some cases can occur spontaneously (i.e., an individual with a pathogenic variant has parents who do not have it).  FAMILY MEMBERS: Knowing if a pathogenic CTNNA1 variant is present is advantageous. At-risk relatives can be identified, enabling pursuit of a diagnostic evaluation.  Further, the available information regarding hereditary cancer susceptibility genes is constantly evolving and more clinically relevant data regarding CTNNA1 are likely to become available in the near future. Awareness of this cancer predisposition encourages patients and their providers to inform at-risk family members, to diligently follow condition-specific screening protocols, and to be vigilant in maintaining close and regular contact with their local genetics clinic in anticipation of new information.It is important that all of Elizabeth Benjamin's relatives (both men and women) know of the presence of this gene mutation. Site-specific genetic testing can sort out who in the family is at risk and who is not.   Elizabeth Benjamin's children and siblings have a 50% chance to have inherited this mutation. We recommend they consider having genetic testing for this same mutation, as identifying the presence of this mutation would allow them to also take advantage of risk-reducing measures. In  order to do this, we strongly recommend that they undergo genetic testing at [facility] in order to receive the same variant classification as Elizabeth Benjamin.  Management Management guidelines for individuals with [place] due to CDH1 pathogenic mutations have been developed (PMID: 16109604, 54098119; [place]. Genetic/Familial High Risk Assessment: Breast and Ovarian. Version 2.2022. Artist. Gastric Cancer. Version 2.2022). Those who have [place] due to a pathogenic variant in CDH1 are advised to pursue total gastrectomy due to the high mortality rate of gastric cancer and the low rate of detection by endoscopic screening. Careful pathologic examination and sampling should occur (PMID: 14782956). Baseline endoscopy is recommended prior to gastrectomy to assess for the presence of macroscopic tumor foci or other factors that may lead to a more complex surgery. At this time, total gastrectomy is NOT recommended for those with CTNNA1 pathogenic variants.    Annual endoscopy is proposed for those with [place] with CDH1 pathogenic variants, who do not opt for prophylactic gastrectomy or for whom gastrectomy has been deferred. It is also recommended for those with CTNNA1 pathogenic variants, per the [facility] fact sheet.  Endoscopic screening including a detailed, 30-minute endoscopic examination of the gastric mucosa with multiple random biopsies and biopsies of subtle lesions is recommended at six- to twelve-month intervals (PMID: 21308657). Screening should begin five to ten years earlier than the earliest age of diagnosis in the family. Also, it is recommended that this procedure be performed at a center that has experience with [place]. While there have been a lack of studies on patients with CTNNA1 pathogenic mutations, microscopic tumor foci have been detected on most prophylactic gastrectomy pathology in those with [place] due to CDH1 pathogenic variants who have  recently undergone endoscopic screening, highlighting the limitations of endoscopic screening in these high risk individuals (PMID: 84696295, 28413244). Affected individuals should be counseled of the high risks of mortality from delaying prophylactic gastrectomy (PMID: 01027253).  An individual's cancer risk and medical management are not determined by genetic test results alone. Overall cancer risk assessment incorporates additional factors, including personal medical history, family history, and any available genetic information that may result in a personalized plan for cancer prevention and surveillance.  In discussing this with Elizabeth Benjamin, she felt that she would like a referral for endoscopy. She reports that she had a colonoscopy at age 86 but does not remember who did it. We will refer her to the [facility] program.  SUPPORT AND RESOURCES: If Elizabeth Benjamin is interested in CDH1-specific information and support, understanding that many of the risks are much higher than what we feel  her risks may be as well as for different things (e.g. breast cancer) many people turn to a group called No Stomach for Cancer (www.nostomachforcancer.org).  For support for hereditary breast cancer, there are two groups, Facing Our Risk (www.facingourriskBenjamin) and Bright Pink (www.brightpink.org) which some people have found useful. They provide opportunities to speak with other individuals from high-risk families. To locate genetic counselors in other cities, visit the website of the [employer] (ArtistMovie.se) and Secretary/administrator for a Social worker by zip code.  We encouraged Ms. Penix to remain in contact with [place] on an annual basis so we can update her personal and family histories, and let her know of advances in cancer genetics that may benefit the family. Our contact number was provided. Elizabeth Benjamin's questions were answered to her satisfaction today, and she knows she is welcome to call anytime with additional  questions.   Elizabeth Benjamin, [place], [facility] Licensed, Insurance risk surveyor Elizabeth Benjamin phone: [phone]  The patient was seen for a total of 45 minutes in face-to-face genetic counseling.

## 2020-12-26 NOTE — Progress Notes (Signed)
Sent message to the schedulers to set up the pt appt

## 2020-12-27 ENCOUNTER — Telehealth: Payer: Self-pay | Admitting: Neurology

## 2020-12-27 LAB — APOE ALZHEIMER'S RISK

## 2020-12-27 LAB — THYROID PANEL WITH TSH
Free Thyroxine Index: 1.3 (ref 1.2–4.9)
T3 Uptake Ratio: 18 % — ABNORMAL LOW (ref 24–39)
T4, Total: 7 ug/dL (ref 4.5–12.0)
TSH: 1.67 u[IU]/mL (ref 0.450–4.500)

## 2020-12-27 LAB — VITAMIN B12: Vitamin B-12: 805 pg/mL (ref 232–1245)

## 2020-12-27 NOTE — Telephone Encounter (Signed)
Called the pt and was able to review the lab results. Advised everything was in normal range. Pt verbalized understanding. Pt had no questions at this time but was encouraged to call back if questions arise.

## 2020-12-27 NOTE — Telephone Encounter (Signed)
-----   Message from Asa Lente, MD sent at 12/27/2020 11:01 AM EDT ----- Please let the patient know that the lab work is fine.  The Alzheimer's genetic test was normal.  Therefore, she does not have increased risk from the ApoE gene

## 2020-12-29 ENCOUNTER — Ambulatory Visit: Payer: 59 | Admitting: Neurology

## 2021-01-20 ENCOUNTER — Other Ambulatory Visit: Payer: Self-pay

## 2021-01-20 DIAGNOSIS — G4700 Insomnia, unspecified: Secondary | ICD-10-CM | POA: Insufficient documentation

## 2021-01-20 DIAGNOSIS — R519 Headache, unspecified: Secondary | ICD-10-CM | POA: Insufficient documentation

## 2021-01-20 DIAGNOSIS — F419 Anxiety disorder, unspecified: Secondary | ICD-10-CM | POA: Insufficient documentation

## 2021-01-20 DIAGNOSIS — E785 Hyperlipidemia, unspecified: Secondary | ICD-10-CM | POA: Insufficient documentation

## 2021-01-20 DIAGNOSIS — Z9889 Other specified postprocedural states: Secondary | ICD-10-CM | POA: Insufficient documentation

## 2021-01-20 DIAGNOSIS — I10 Essential (primary) hypertension: Secondary | ICD-10-CM | POA: Insufficient documentation

## 2021-01-20 DIAGNOSIS — G2581 Restless legs syndrome: Secondary | ICD-10-CM | POA: Insufficient documentation

## 2021-01-20 DIAGNOSIS — T8859XA Other complications of anesthesia, initial encounter: Secondary | ICD-10-CM | POA: Insufficient documentation

## 2021-01-20 DIAGNOSIS — Z8719 Personal history of other diseases of the digestive system: Secondary | ICD-10-CM

## 2021-01-20 DIAGNOSIS — F1320 Sedative, hypnotic or anxiolytic dependence, uncomplicated: Secondary | ICD-10-CM | POA: Insufficient documentation

## 2021-01-20 DIAGNOSIS — F1120 Opioid dependence, uncomplicated: Secondary | ICD-10-CM | POA: Insufficient documentation

## 2021-01-20 DIAGNOSIS — R112 Nausea with vomiting, unspecified: Secondary | ICD-10-CM | POA: Insufficient documentation

## 2021-01-20 HISTORY — DX: Personal history of other diseases of the digestive system: Z87.19

## 2021-01-26 ENCOUNTER — Encounter: Payer: Self-pay | Admitting: Cardiology

## 2021-01-26 ENCOUNTER — Other Ambulatory Visit: Payer: Self-pay

## 2021-01-26 ENCOUNTER — Ambulatory Visit (INDEPENDENT_AMBULATORY_CARE_PROVIDER_SITE_OTHER): Payer: 59 | Admitting: Cardiology

## 2021-01-26 VITALS — BP 134/84 | HR 103 | Ht 65.0 in | Wt 193.6 lb

## 2021-01-26 DIAGNOSIS — E782 Mixed hyperlipidemia: Secondary | ICD-10-CM | POA: Diagnosis not present

## 2021-01-26 DIAGNOSIS — I10 Essential (primary) hypertension: Secondary | ICD-10-CM | POA: Diagnosis not present

## 2021-01-26 DIAGNOSIS — R011 Cardiac murmur, unspecified: Secondary | ICD-10-CM

## 2021-01-26 DIAGNOSIS — R0600 Dyspnea, unspecified: Secondary | ICD-10-CM

## 2021-01-26 DIAGNOSIS — R0609 Other forms of dyspnea: Secondary | ICD-10-CM

## 2021-01-26 DIAGNOSIS — R079 Chest pain, unspecified: Secondary | ICD-10-CM | POA: Insufficient documentation

## 2021-01-26 HISTORY — DX: Chest pain, unspecified: R07.9

## 2021-01-26 HISTORY — DX: Cardiac murmur, unspecified: R01.1

## 2021-01-26 HISTORY — DX: Other forms of dyspnea: R06.09

## 2021-01-26 NOTE — Patient Instructions (Signed)
Medication Instructions:  Your physician recommends that you continue on your current medications as directed. Please refer to the Current Medication list given to you today.  *If you need a refill on your cardiac medications before your next appointment, please call your pharmacy*   Lab Work: None ordered If you have labs (blood work) drawn today and your tests are completely normal, you will receive your results only by: MyChart Message (if you have MyChart) OR A paper copy in the mail If you have any lab test that is abnormal or we need to change your treatment, we will call you to review the results.   Testing/Procedures: Your physician has requested that you have a lexiscan myoview. For further information please visit [email or web address]. Please follow instruction sheet, as given.  The test will take approximately 3 to 4 hours to complete; you may bring reading material.  If someone comes with you to your appointment, they will need to remain in the main lobby due to limited space in the testing area. **If you are pregnant or breastfeeding, please notify the nuclear lab prior to your appointment**  How to prepare for your Myocardial Perfusion Test: Do not eat or drink 3 hours prior to your test, except you may have water. Do not consume products containing caffeine (regular or decaffeinated) 12 hours prior to your test. (ex: coffee, chocolate, sodas, tea). Do bring a list of your current medications with you.  If not listed below, you may take your medications as normal. Do wear comfortable clothes (no dresses or overalls) and walking shoes, tennis shoes preferred (No heels or open toe shoes are allowed). Do NOT wear cologne, perfume, aftershave, or lotions (deodorant is allowed). If these instructions are not followed, your test will have to be rescheduled.  Your physician has requested that you have an echocardiogram. Echocardiography is a painless test that uses sound waves to  create images of your heart. It provides your doctor with information about the size and shape of your heart and how well your heart's chambers and valves are working. This procedure takes approximately one hour. There are no restrictions for this procedure.   Follow-Up: At [facility], you and your health needs are our priority.  As part of our continuing mission to provide you with exceptional heart care, we have created designated Provider Care Teams.  These Care Teams include your primary Cardiologist (physician) and Advanced Practice Providers (APPs -  Physician Assistants and Nurse Practitioners) who all work together to provide you with the care you need, when you need it.  We recommend signing up for the patient portal called "MyChart".  Sign up information is provided on this After Visit Summary.  MyChart is used to connect with patients for Virtual Visits (Telemedicine).  Patients are able to view lab/test results, encounter notes, upcoming appointments, etc.  Non-urgent messages can be sent to your provider as well.   To learn more about what you can do with MyChart, go to [email or web address].    Your next appointment:   6 month(s)  The format for your next appointment:   In Person  Provider:   Rajan Revankar, MD   Other Instructions Cardiac Nuclear Scan A cardiac nuclear scan is a test that is done to check the flow of blood to your heart. It is done when you are resting and when you are exercising. The test looks for problems such as: Not enough blood reaching a portion of the heart. The heart muscle not working as   it should. You may need this test if: You have heart disease. You have had lab results that are not normal. You have had heart surgery or a balloon procedure to open up blocked arteries (angioplasty). You have chest pain. You have shortness of breath. In this test, a special dye (tracer) is put into your bloodstream. The tracer will travel to your heart. A  camera will then take pictures of your heart to see how the tracer moves through your heart. This test is usually done at a hospital and takes 2-4 hours. Tell a doctor about: Any allergies you have. All medicines you are taking, including vitamins, herbs, eye drops, creams, and over-the-counter medicines. Any problems you or family members have had with anesthetic medicines. Any blood disorders you have. Any surgeries you have had. Any medical conditions you have. Whether you are pregnant or may be pregnant. What are the risks? Generally, this is a safe test. However, problems may occur, such as: Serious chest pain and heart attack. This is only a risk if the stress portion of the test is done. Rapid heartbeat. A feeling of warmth in your chest. This feeling usually does not last long. Allergic reaction to the tracer. What happens before the test? Ask your doctor about changing or stopping your normal medicines. This is important. Follow instructions from your doctor about what you cannot eat or drink. Remove your jewelry on the day of the test. What happens during the test? An IV tube will be inserted into one of your veins. Your doctor will give you a small amount of tracer through the IV tube. You will wait for 20-40 minutes while the tracer moves through your bloodstream. Your heart will be monitored with an electrocardiogram (ECG). You will lie down on an exam table. Pictures of your heart will be taken for about 15-20 minutes. You may also have a stress test. For this test, one of these things may be done: You will be asked to exercise on a treadmill or a stationary bike. You will be given medicines that will make your heart work harder. This is done if you are unable to exercise. When blood flow to your heart has peaked, a tracer will again be given through the IV tube. After 20-40 minutes, you will get back on the exam table. More pictures will be taken of your heart. Depending  on the tracer that is used, more pictures may need to be taken 3-4 hours later. Your IV tube will be removed when the test is over. The test may vary among doctors and hospitals. What happens after the test? Ask your doctor: Whether you can return to your normal schedule, including diet, activities, and medicines. Whether you should drink more fluids. This will help to remove the tracer from your body. Drink enough fluid to keep your pee (urine) pale yellow. Ask your doctor, or the department that is doing the test: When will my results be ready? How will I get my results? Summary A cardiac nuclear scan is a test that is done to check the flow of blood to your heart. Tell your doctor whether you are pregnant or may be pregnant. Before the test, ask your doctor about changing or stopping your normal medicines. This is important. Ask your doctor whether you can return to your normal activities. You may be asked to drink more fluids. This information is not intended to replace advice given to you by your health care provider. Make sure you discuss any   questions you have with your health care provider. Document Revised: 08/27/2018 Document Reviewed: 10/21/2017 Elsevier Patient Education  2021 Elsevier Inc.    Echocardiogram An echocardiogram is a test that uses sound waves (ultrasound) to produce images of the heart. Images from an echocardiogram can provide important information about: Heart size and shape. The size and thickness and movement of your heart's walls. Heart muscle function and strength. Heart valve function or if you have stenosis. Stenosis is when the heart valves are too narrow. If blood is flowing backward through the heart valves (regurgitation). A tumor or infectious growth around the heart valves. Areas of heart muscle that are not working well because of poor blood flow or injury from a heart attack. Aneurysm detection. An aneurysm is a weak or damaged part of an  artery wall. The wall bulges out from the normal force of blood pumping through the body. Tell a health care provider about: Any allergies you have. All medicines you are taking, including vitamins, herbs, eye drops, creams, and over-the-counter medicines. Any blood disorders you have. Any surgeries you have had. Any medical conditions you have. Whether you are pregnant or may be pregnant. What are the risks? Generally, this is a safe test. However, problems may occur, including an allergic reaction to dye (contrast) that may be used during the test. What happens before the test? No specific preparation is needed. You may eat and drink normally. What happens during the test? You will take off your clothes from the waist up and put on a hospital gown. Electrodes or electrocardiogram (ECG)patches may be placed on your chest. The electrodes or patches are then connected to a device that monitors your heart rate and rhythm. You will lie down on a table for an ultrasound exam. A gel will be applied to your chest to help sound waves pass through your skin. A handheld device, called a transducer, will be pressed against your chest and moved over your heart. The transducer produces sound waves that travel to your heart and bounce back (or "echo" back) to the transducer. These sound waves will be captured in real-time and changed into images of your heart that can be viewed on a video monitor. The images will be recorded on a computer and reviewed by your health care provider. You may be asked to change positions or hold your breath for a short time. This makes it easier to get different views or better views of your heart. In some cases, you may receive contrast through an IV in one of your veins. This can improve the quality of the pictures from your heart. The procedure may vary among health care providers and hospitals.    What can I expect after the test? You may return to your normal, everyday  life, including diet, activities, and medicines, unless your health care provider tells you not to do that. Follow these instructions at home: It is up to you to get the results of your test. Ask your health care provider, or the department that is doing the test, when your results will be ready. Keep all follow-up visits. This is important. Summary An echocardiogram is a test that uses sound waves (ultrasound) to produce images of the heart. Images from an echocardiogram can provide important information about the size and shape of your heart, heart muscle function, heart valve function, and other possible heart problems. You do not need to do anything to prepare before this test. You may eat and drink normally. After   the echocardiogram is completed, you may return to your normal, everyday life, unless your health care provider tells you not to do that. This information is not intended to replace advice given to you by your health care provider. Make sure you discuss any questions you have with your health care provider. Document Revised: 12/29/2019 Document Reviewed: 12/29/2019 Elsevier Patient Education  2021 Elsevier Inc.  

## 2021-01-26 NOTE — Progress Notes (Signed)
Cardiology Office Note:    Date:  01/26/2021   ID:  Elizabeth Benjamin, DOB 09-19-1960, MRN [id number]  PCP:  Leane Call, PA-C  Cardiologist:  Garwin Brothers, MD   Referring MD: Richardean Chimera, MD    ASSESSMENT:    1. Murmur, cardiac   2. Mixed hyperlipidemia   3. Primary hypertension   4. Dyspnea on exertion   5. Chest pain of uncertain etiology   6. Cardiac murmur    PLAN:    In order of problems listed above:  Primary prevention stressed with the patient.  Importance of compliance with diet medication stressed and she vocalized understanding Chest pain: Atypical in nature however in view of risk factors we will do a Lexiscan sestamibi.  She is agreeable.  This will help [place] assess for any coronary etiology for her pain. Cardiac murmur: Echocardiogram done to assess murmur heard on auscultation. Obesity: Weight reduction was stressed and diet was emphasized and lifestyle modification discussed and she understands. Patient will be seen in follow-up appointment in 6 months or earlier if the patient has any concerns    Medication Adjustments/Labs and Tests Ordered: Current medicines are reviewed at length with the patient today.  Concerns regarding medicines are outlined above.  Orders Placed This Encounter  Procedures   MYOCARDIAL PERFUSION IMAGING   EKG 12-Lead   ECHOCARDIOGRAM COMPLETE    No orders of the defined types were placed in this encounter.    History of Present Illness:    Elizabeth Benjamin is a 60 y.o. female who is being seen today for the evaluation of chest pain at the request of Richardean Chimera, MD. patient is a pleasant 60 year old female.  She has past medical history of essential hypertension and dyslipidemia.  She has significant neck pain issues and has undergone surgeries and is on pain medication for this at the pain clinic.  She tells me that she occasionally has chest tightness and dyspnea on exertion.  No orthopnea or PND.  No radiation of this  chest tightness to the neck or to the arms.  At the time of my evaluation, the patient is alert awake oriented and in no distress.  Past Medical History:  Diagnosis Date   Anemia 03/22/2017   Anxiety    Arthritis 07/12/2015   Benzodiazepine dependence (HCC)    Carpal tunnel syndrome of left wrist 08/21/2018   Carpal tunnel syndrome of right wrist 08/21/2018   Cervical post-laminectomy syndrome 05/23/2020   Chronic low back pain 05/22/2017   Chronic neck pain 05/22/2017   Chronic pain syndrome 10/03/2016   Complication of anesthesia    Depression with anxiety 12/20/2020   Drug-induced constipation 11/18/2018   Dry eyes, bilateral 07/16/2018   Dyspepsia 11/18/2018   Family history of Alzheimer's disease 12/20/2020   Family history of breast cancer    Fracture of proximal phalanx of finger 10/03/2017   Genetic testing    GERD (gastroesophageal reflux disease) 03/22/2017   H/O hiatal hernia    Headache    Hx of migraines 02/13/2016   Hyperlipidemia    Hypertension    Insomnia    Mouth dryness 07/16/2018   Neck pain 04/02/2018   Opioid dependence (HCC)    Palpitations 04/26/2008   Qualifier: Diagnosis of  By: Johney Frame, MD, James     PONV (postoperative nausea and vomiting)    Restless legs    S/P cervical spinal fusion 12/02/2013   Snoring 12/20/2020   Subjective memory complaints 12/20/2020   Therapeutic  opioid induced constipation 05/30/2017   Trigger thumb of left hand 12/22/2020   Vaginal dryness 07/16/2018   Last Assessment & Plan:  Formatting of this note might be different from the original. Multiple sites of dryness, including oral, eyes, and vaginal.  Has had 50% improvement with using olive oil rinses, good hydration, and dry eyes.  Have discussed her evolution of dryness, she is interested in trying pilocarpine 5 mg TID as needed for one month trial.  She will continue with the CBD oil she makes    Past Surgical History:  Procedure Laterality Date   ANTERIOR CERVICAL DECOMP/DISCECTOMY FUSION N/A  12/02/2013   Procedure: Cervical Four-Five Cervical Six-Seven Anterior cervical decompression/diskectomy/fusion with removal of Cervical Five-Six;  Surgeon: Tia Alert, MD;  Location: [facility] NEURO ORS;  Service: Neurosurgery;  Laterality: N/A;  Cervical Four-Five Cervical Six-Seven Anterior cervical decompression/diskectomy/fusion with removal of Cervical Five-Six   BACK SURGERY     cervical fusion 2009    Current Medications: Current Meds  Medication Sig   citalopram (CELEXA) 20 MG tablet Take 20 mg by mouth daily.   cyclobenzaprine (FLEXERIL) 10 MG tablet Take 10 mg by mouth 3 (three) times daily as needed for muscle spasms.   doxycycline (PERIOSTAT) 20 MG tablet Take 20 mg by mouth 2 (two) times daily.   DULoxetine (CYMBALTA) 60 MG capsule Take 120 mg by mouth daily.   gabapentin (NEURONTIN) 300 MG capsule Take 3 capsules by mouth in the morning, at noon, and at bedtime.   lisinopril (ZESTRIL) 20 MG tablet Take 20 mg by mouth daily.   LORazepam (ATIVAN) 1 MG tablet Take 1 mg by mouth every 8 (eight) hours as needed for anxiety.   omeprazole (PRILOSEC) 40 MG capsule Take 40 mg by mouth daily.   Oxycodone HCl 10 MG TABS Take 10 mg by mouth every 6 (six) hours as needed (pain).   rOPINIRole (REQUIP) 1 MG tablet Take 1 mg by mouth at bedtime as needed (for restless leg syndrome).   traZODone (DESYREL) 150 MG tablet Take 300 mg by mouth at bedtime as needed.     Allergies:   Patient has no known allergies.   Social History   Socioeconomic History   Marital status: Married    Spouse name: Not on file   Number of children: Not on file   Years of education: Not on file   Highest education level: Not on file  Occupational History   Not on file  Tobacco Use   Smoking status: Never   Smokeless tobacco: Never  Substance and Sexual Activity   Alcohol use: No   Drug use: No   Sexual activity: Yes    Birth control/protection: Pill  Other Topics Concern   Not on file  Social History  Narrative   Not on file   Social Determinants of Health   Financial Resource Strain: Not on file  Food Insecurity: Not on file  Transportation Needs: Not on file  Physical Activity: Not on file  Stress: Not on file  Social Connections: Not on file     Family History: The patient's family history includes Brain cancer in her sister; Breast cancer in her cousin; Breast cancer (age of onset: 57) in her niece; COPD in her maternal grandfather; Cerebral aneurysm (age of onset: 70) in her paternal uncle; Cerebral aneurysm (age of onset: 80) in her paternal uncle; Cerebral aneurysm (age of onset: 45) in her paternal grandmother; Dementia in her father, paternal aunt, and paternal aunt; Ehlers-Danlos  syndrome in her daughter; Stroke in her maternal grandmother.  ROS:   Please see the history of present illness.    All other systems reviewed and are negative.  EKGs/Labs/Other Studies Reviewed:    The following studies were reviewed today: EKG reveals sinus rhythm and nonspecific ST-T changes   Recent Labs: 12/20/2020: TSH 1.670  Recent Lipid Panel No results found for: CHOL, TRIG, HDL, CHOLHDL, VLDL, LDLCALC, LDLDIRECT  Physical Exam:    VS:  BP 134/84   Pulse (!) 103   Ht 5\' 5"  (1.651 m)   Wt 193 lb 9.6 oz (87.8 kg)   SpO2 93%   BMI 32.22 kg/m     Wt Readings from Last 3 Encounters:  01/26/21 193 lb 9.6 oz (87.8 kg)  12/20/20 191 lb (86.6 kg)  12/02/13 193 lb 2 oz (87.6 kg)     GEN: Patient is in no acute distress HEENT: Normal NECK: No JVD; No carotid bruits LYMPHATICS: No lymphadenopathy CARDIAC: S1 S2 regular, 2/6 systolic murmur at the apex. RESPIRATORY:  Clear to auscultation without rales, wheezing or rhonchi  ABDOMEN: Soft, non-tender, non-distended MUSCULOSKELETAL:  No edema; No deformity  SKIN: Warm and dry NEUROLOGIC:  Alert and oriented x 3 PSYCHIATRIC:  Normal affect    Signed, 12/04/13, MD  01/26/2021 2:31 PM    [facility]

## 2021-02-02 ENCOUNTER — Telehealth (HOSPITAL_COMMUNITY): Payer: Self-pay | Admitting: *Deleted

## 2021-02-02 NOTE — Telephone Encounter (Signed)
Left message on voicemail per DPR in reference to upcoming appointment scheduled on  02/09/21 with detailed instructions given per Myocardial Perfusion Study Information Sheet for the test. LM to arrive 15 minutes early, and that it is imperative to arrive on time for appointment to keep from having the test rescheduled. If you need to cancel or reschedule your appointment, please call the office within 24 hours of your appointment. Failure to do so may result in a cancellation of your appointment, and a $50 no show fee. Phone number given for call back for any questions. Ricky Ala

## 2021-02-09 ENCOUNTER — Other Ambulatory Visit: Payer: 59

## 2021-03-01 ENCOUNTER — Telehealth (HOSPITAL_COMMUNITY): Payer: Self-pay | Admitting: *Deleted

## 2021-03-01 NOTE — Telephone Encounter (Signed)
Patient given detailed instructions per Myocardial Perfusion Study Information Sheet for the test on 03/08/21 at 0815. Patient notified to arrive 15 minutes early and that it is imperative to arrive on time for appointment to keep from having the test rescheduled.  If you need to cancel or reschedule your appointment, please call the office within 24 hours of your appointment. . Patient verbalized understanding.Woodward Klem, Adelene Idler No mychart

## 2021-03-08 ENCOUNTER — Other Ambulatory Visit: Payer: Self-pay

## 2021-03-08 ENCOUNTER — Ambulatory Visit (INDEPENDENT_AMBULATORY_CARE_PROVIDER_SITE_OTHER): Payer: 59

## 2021-03-08 DIAGNOSIS — R011 Cardiac murmur, unspecified: Secondary | ICD-10-CM

## 2021-03-08 LAB — ECHOCARDIOGRAM COMPLETE
Area-P 1/2: 3.7 cm2
S' Lateral: 3.4 cm

## 2021-03-14 ENCOUNTER — Telehealth: Payer: Self-pay

## 2021-03-14 NOTE — Telephone Encounter (Signed)
Spoke with patient regarding results and recommendation.  Patient verbalizes understanding and is agreeable to plan of care. Advised patient to call back with any issues or concerns.  

## 2021-03-14 NOTE — Telephone Encounter (Signed)
-----   Message from Garwin Brothers, MD sent at 03/13/2021 11:58 PM EDT ----- The results of the study is unremarkable. Please inform patient. I will discuss in detail at next appointment. Cc  primary care/referring physician Garwin Brothers, MD 03/13/2021 11:58 PM

## 2021-03-21 ENCOUNTER — Telehealth (HOSPITAL_COMMUNITY): Payer: Self-pay | Admitting: *Deleted

## 2021-03-21 NOTE — Telephone Encounter (Signed)
Left message on voicemail per DPR in reference to upcoming appointment scheduled on 03/28/21 at 11:00 with detailed instructions given per Myocardial Perfusion Study Information Sheet for the test. LM to arrive 15 minutes early, and that it is imperative to arrive on time for appointment to keep from having the test rescheduled. If you need to cancel or reschedule your appointment, please call the office within 24 hours of your appointment. Failure to do so may result in a cancellation of your appointment, and a $50 no show fee. Phone number given for call back for any questions.

## 2021-03-28 ENCOUNTER — Other Ambulatory Visit: Payer: Self-pay

## 2021-03-28 ENCOUNTER — Ambulatory Visit (INDEPENDENT_AMBULATORY_CARE_PROVIDER_SITE_OTHER): Payer: 59

## 2021-03-28 DIAGNOSIS — R0609 Other forms of dyspnea: Secondary | ICD-10-CM

## 2021-03-28 LAB — MYOCARDIAL PERFUSION IMAGING
LV dias vol: 76 mL (ref 46–106)
LV sys vol: 22 mL
Nuc Stress EF: 71 %
Peak HR: 99 {beats}/min
Rest HR: 63 {beats}/min
Rest Nuclear Isotope Dose: 8.3 mCi
SDS: 6
SRS: 2
SSS: 8
Stress Nuclear Isotope Dose: 26 mCi
TID: 1.25

## 2021-03-28 MED ORDER — REGADENOSON 0.4 MG/5ML IV SOLN
0.4000 mg | Freq: Once | INTRAVENOUS | Status: AC
  Administered 2021-03-28: 0.4 mg via INTRAVENOUS

## 2021-03-28 MED ORDER — TECHNETIUM TC 99M TETROFOSMIN IV KIT
26.0000 | PACK | Freq: Once | INTRAVENOUS | Status: AC | PRN
  Administered 2021-03-28: 26 via INTRAVENOUS

## 2021-03-28 MED ORDER — TECHNETIUM TC 99M TETROFOSMIN IV KIT
8.3000 | PACK | Freq: Once | INTRAVENOUS | Status: AC | PRN
  Administered 2021-03-28: 8.3 via INTRAVENOUS

## 2021-04-12 ENCOUNTER — Telehealth: Payer: Self-pay | Admitting: Gastroenterology

## 2021-04-12 NOTE — Telephone Encounter (Signed)
Spoke with patient. Patient has appt set-up in January with [facility].Marland KitchenMarland Kitchen

## 2021-07-27 ENCOUNTER — Ambulatory Visit: Payer: 59 | Admitting: Cardiology

## 2021-09-12 ENCOUNTER — Emergency Department (HOSPITAL_COMMUNITY): Payer: 59

## 2021-09-12 ENCOUNTER — Encounter (HOSPITAL_COMMUNITY): Payer: Self-pay

## 2021-09-12 ENCOUNTER — Emergency Department (HOSPITAL_COMMUNITY)
Admission: EM | Admit: 2021-09-12 | Discharge: 2021-09-12 | Disposition: A | Discharge status: Home / Self Care | Payer: 59 | Attending: Emergency Medicine | Admitting: Emergency Medicine

## 2021-09-12 DIAGNOSIS — S40022A Contusion of left upper arm, initial encounter: Secondary | ICD-10-CM | POA: Insufficient documentation

## 2021-09-12 DIAGNOSIS — S199XXA Unspecified injury of neck, initial encounter: Secondary | ICD-10-CM | POA: Diagnosis present

## 2021-09-12 DIAGNOSIS — E049 Nontoxic goiter, unspecified: Secondary | ICD-10-CM | POA: Diagnosis not present

## 2021-09-12 DIAGNOSIS — S60211A Contusion of right wrist, initial encounter: Secondary | ICD-10-CM | POA: Diagnosis not present

## 2021-09-12 DIAGNOSIS — E079 Disorder of thyroid, unspecified: Secondary | ICD-10-CM

## 2021-09-12 DIAGNOSIS — S161XXA Strain of muscle, fascia and tendon at neck level, initial encounter: Secondary | ICD-10-CM | POA: Insufficient documentation

## 2021-09-12 DIAGNOSIS — M549 Dorsalgia, unspecified: Secondary | ICD-10-CM | POA: Insufficient documentation

## 2021-09-12 DIAGNOSIS — Z20822 Contact with and (suspected) exposure to covid-19: Secondary | ICD-10-CM | POA: Insufficient documentation

## 2021-09-12 LAB — CBC
HCT: 38.7 % (ref 36.0–46.0)
Hemoglobin: 12.6 g/dL (ref 12.0–15.0)
MCH: 30.7 pg (ref 26.0–34.0)
MCHC: 32.6 g/dL (ref 30.0–36.0)
MCV: 94.4 fL (ref 80.0–100.0)
Platelets: 193 10*3/uL (ref 150–400)
RBC: 4.1 MIL/uL (ref 3.87–5.11)
RDW: 12 % (ref 11.5–15.5)
WBC: 5.9 10*3/uL (ref 4.0–10.5)
nRBC: 0 % (ref 0.0–0.2)

## 2021-09-12 LAB — BASIC METABOLIC PANEL
Anion gap: 9 (ref 5–15)
BUN: 17 mg/dL (ref 6–20)
CO2: 24 mmol/L (ref 22–32)
Calcium: 9.2 mg/dL (ref 8.9–10.3)
Chloride: 105 mmol/L (ref 98–111)
Creatinine, Ser: 0.75 mg/dL (ref 0.44–1.00)
GFR, Estimated: >60 mL/min (ref >60)
Glucose, Bld: 91 mg/dL (ref 70–99)
Potassium: 3.7 mmol/L (ref 3.5–5.1)
Sodium: 138 mmol/L (ref 135–145)
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
## 2021-09-12 NOTE — Discharge Instructions (Addendum)
Continue your pain meds as prescribed by your doctor.  Your CT scan today did not show any fractures or significant injuries ? ?You have thyroid mass or nodule that can be follow-up with your doctor ? ?See your doctor for follow-up ? ?Return to ER if you have worse neck pain, neck swelling, trouble breathing. ? ?If you feel unsafe at home, please call 911 immediately. ?
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
## 2021-09-12 NOTE — ED Notes (Signed)
Off [facility] to bedside per pt request  ?

## 2021-09-12 NOTE — ED Notes (Signed)
Pt d/c home per MD order. Discharge summary reviewed, pt verbalizes understanding. Ambulatory off unit. No s/s of acute distress noted at discharge.  °
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
## 2021-10-09 ENCOUNTER — Ambulatory Visit: Payer: 59 | Admitting: Cardiology

## 2021-12-20 ENCOUNTER — Ambulatory Visit: Payer: 59 | Admitting: Neurology

## 2022-02-13 ENCOUNTER — Other Ambulatory Visit: Payer: Self-pay

## 2022-02-14 ENCOUNTER — Ambulatory Visit: Payer: Self-pay | Admitting: Cardiology

## 2022-05-31 ENCOUNTER — Other Ambulatory Visit: Payer: Self-pay | Admitting: Obstetrics and Gynecology

## 2022-05-31 DIAGNOSIS — E041 Nontoxic single thyroid nodule: Secondary | ICD-10-CM

## 2022-06-29 ENCOUNTER — Other Ambulatory Visit: Payer: Self-pay

## 2022-07-18 ENCOUNTER — Ambulatory Visit
Admission: RE | Admit: 2022-07-18 | Discharge: 2022-07-18 | Disposition: A | Discharge status: Home / Self Care | Payer: 59 | Source: Ambulatory Visit | Attending: Obstetrics and Gynecology | Admitting: Obstetrics and Gynecology

## 2022-07-18 DIAGNOSIS — E041 Nontoxic single thyroid nodule: Secondary | ICD-10-CM

## 2022-07-27 ENCOUNTER — Other Ambulatory Visit: Payer: Self-pay | Admitting: Obstetrics and Gynecology

## 2022-07-27 DIAGNOSIS — E041 Nontoxic single thyroid nodule: Secondary | ICD-10-CM

## 2022-08-27 ENCOUNTER — Ambulatory Visit
Admission: RE | Admit: 2022-08-27 | Discharge: 2022-08-27 | Disposition: A | Discharge status: Home / Self Care | Payer: 59 | Source: Ambulatory Visit | Attending: Obstetrics and Gynecology | Admitting: Obstetrics and Gynecology

## 2022-08-27 ENCOUNTER — Other Ambulatory Visit (HOSPITAL_COMMUNITY)
Admission: RE | Admit: 2022-08-27 | Discharge: 2022-08-27 | Disposition: A | Discharge status: Home / Self Care | Payer: 59 | Source: Ambulatory Visit | Attending: Obstetrics and Gynecology | Admitting: Obstetrics and Gynecology

## 2022-08-27 DIAGNOSIS — E041 Nontoxic single thyroid nodule: Secondary | ICD-10-CM | POA: Insufficient documentation

## 2022-08-30 LAB — CYTOLOGY - NON PAP

## 2023-07-22 ENCOUNTER — Other Ambulatory Visit (HOSPITAL_BASED_OUTPATIENT_CLINIC_OR_DEPARTMENT_OTHER): Payer: Self-pay | Admitting: Obstetrics and Gynecology

## 2023-07-22 DIAGNOSIS — Z8249 Family history of ischemic heart disease and other diseases of the circulatory system: Secondary | ICD-10-CM

## 2023-07-29 ENCOUNTER — Ambulatory Visit (HOSPITAL_BASED_OUTPATIENT_CLINIC_OR_DEPARTMENT_OTHER)
Admission: RE | Admit: 2023-07-29 | Discharge: 2023-07-29 | Disposition: A | Discharge status: Home / Self Care | Payer: Self-pay | Source: Ambulatory Visit | Attending: Obstetrics and Gynecology | Admitting: Obstetrics and Gynecology

## 2023-07-29 DIAGNOSIS — Z8249 Family history of ischemic heart disease and other diseases of the circulatory system: Secondary | ICD-10-CM

## 2023-08-28 ENCOUNTER — Other Ambulatory Visit: Payer: Self-pay

## 2023-09-03 ENCOUNTER — Ambulatory Visit: Admitting: Cardiology

## 2023-09-11 ENCOUNTER — Other Ambulatory Visit: Payer: Self-pay | Admitting: Nurse Practitioner

## 2023-09-11 DIAGNOSIS — E042 Nontoxic multinodular goiter: Secondary | ICD-10-CM

## 2023-09-18 ENCOUNTER — Other Ambulatory Visit

## 2023-09-20 ENCOUNTER — Ambulatory Visit
Admission: RE | Admit: 2023-09-20 | Discharge: 2023-09-20 | Disposition: A | Discharge status: Home / Self Care | Source: Ambulatory Visit | Attending: Nurse Practitioner | Admitting: Nurse Practitioner

## 2023-09-20 DIAGNOSIS — E042 Nontoxic multinodular goiter: Secondary | ICD-10-CM
